# Patient Record
Sex: Male | Born: 1950
Health system: Southern US, Community
[De-identification: ages and names within clinical notes are randomized; demographics above are authoritative.]

## PROBLEM LIST (undated history)

## (undated) DIAGNOSIS — R945 Abnormal results of liver function studies: Secondary | ICD-10-CM

## (undated) DIAGNOSIS — M859 Disorder of bone density and structure, unspecified: Secondary | ICD-10-CM

## (undated) DIAGNOSIS — I4510 Unspecified right bundle-branch block: Secondary | ICD-10-CM

## (undated) DIAGNOSIS — E785 Hyperlipidemia, unspecified: Secondary | ICD-10-CM

## (undated) DIAGNOSIS — Z9089 Acquired absence of other organs: Secondary | ICD-10-CM

## (undated) DIAGNOSIS — R748 Abnormal levels of other serum enzymes: Secondary | ICD-10-CM

## (undated) DIAGNOSIS — R7989 Other specified abnormal findings of blood chemistry: Secondary | ICD-10-CM

## (undated) DIAGNOSIS — M1990 Unspecified osteoarthritis, unspecified site: Secondary | ICD-10-CM

## (undated) DIAGNOSIS — I2510 Atherosclerotic heart disease of native coronary artery without angina pectoris: Secondary | ICD-10-CM

## (undated) DIAGNOSIS — Z9852 Vasectomy status: Secondary | ICD-10-CM

## (undated) DIAGNOSIS — C801 Malignant (primary) neoplasm, unspecified: Secondary | ICD-10-CM

## (undated) DIAGNOSIS — F419 Anxiety disorder, unspecified: Secondary | ICD-10-CM

## (undated) DIAGNOSIS — I420 Dilated cardiomyopathy: Secondary | ICD-10-CM

## (undated) DIAGNOSIS — R001 Bradycardia, unspecified: Secondary | ICD-10-CM

## (undated) DIAGNOSIS — I493 Ventricular premature depolarization: Secondary | ICD-10-CM

## (undated) DIAGNOSIS — M8580 Other specified disorders of bone density and structure, unspecified site: Secondary | ICD-10-CM

## (undated) DIAGNOSIS — K4090 Unilateral inguinal hernia, without obstruction or gangrene, not specified as recurrent: Secondary | ICD-10-CM

## (undated) DIAGNOSIS — E230 Hypopituitarism: Secondary | ICD-10-CM

## (undated) HISTORY — DX: Acquired absence of other organs: Z90.89

## (undated) HISTORY — DX: Vasectomy status: Z98.52

## (undated) HISTORY — DX: Unilateral inguinal hernia, without obstruction or gangrene, not specified as recurrent: K40.90

## (undated) HISTORY — DX: Bradycardia, unspecified: R00.1

## (undated) HISTORY — DX: Disorder of bone density and structure, unspecified: M85.9

## (undated) HISTORY — DX: Other specified disorders of bone density and structure, unspecified site: M85.80

## (undated) HISTORY — DX: Ventricular premature depolarization: I49.3

## (undated) HISTORY — DX: Hypopituitarism: E23.0

## (undated) HISTORY — PX: TONSILLECTOMY: SUR1361

## (undated) HISTORY — DX: Abnormal results of liver function studies: R94.5

## (undated) HISTORY — PX: CARDIAC CATHETERIZATION: SHX172

## (undated) HISTORY — PX: VASECTOMY: SHX75

## (undated) HISTORY — DX: Other specified abnormal findings of blood chemistry: R79.89

## (undated) HISTORY — PX: CYSTECTOMY: SUR359

## (undated) HISTORY — PX: HERNIA REPAIR: SHX51

## (undated) HISTORY — DX: Abnormal levels of other serum enzymes: R74.8

## (undated) HISTORY — DX: Hyperlipidemia, unspecified: E78.5

## (undated) HISTORY — DX: Atherosclerotic heart disease of native coronary artery without angina pectoris: I25.10

## (undated) HISTORY — DX: Unspecified right bundle-branch block: I45.10

---

## 2001-05-04 ENCOUNTER — Emergency Department (HOSPITAL_COMMUNITY): Admission: EM | Admit: 2001-05-04 | Discharge: 2001-05-04 | Payer: Self-pay | Admitting: Emergency Medicine

## 2001-05-07 ENCOUNTER — Emergency Department (HOSPITAL_COMMUNITY): Admission: EM | Admit: 2001-05-07 | Discharge: 2001-05-07 | Payer: Self-pay

## 2001-05-11 ENCOUNTER — Encounter (HOSPITAL_COMMUNITY): Admission: RE | Admit: 2001-05-11 | Discharge: 2001-08-09 | Payer: Self-pay

## 2005-10-30 HISTORY — PX: COLONOSCOPY: SHX174

## 2009-02-07 ENCOUNTER — Encounter: Admission: RE | Admit: 2009-02-07 | Discharge: 2009-02-07 | Payer: Self-pay | Admitting: Family Medicine

## 2009-04-09 ENCOUNTER — Encounter: Admission: RE | Admit: 2009-04-09 | Discharge: 2009-04-09 | Payer: Self-pay | Admitting: Family Medicine

## 2009-07-23 ENCOUNTER — Encounter: Admission: RE | Admit: 2009-07-23 | Discharge: 2009-07-23 | Payer: Self-pay | Admitting: Family Medicine

## 2011-11-09 ENCOUNTER — Emergency Department (INDEPENDENT_AMBULATORY_CARE_PROVIDER_SITE_OTHER)
Admission: EM | Admit: 2011-11-09 | Discharge: 2011-11-09 | Disposition: A | Discharge status: Home / Self Care | Payer: Self-pay | Source: Home / Self Care | Attending: Emergency Medicine | Admitting: Emergency Medicine

## 2011-11-09 ENCOUNTER — Other Ambulatory Visit: Payer: Self-pay

## 2011-11-09 ENCOUNTER — Encounter: Payer: Self-pay | Admitting: *Deleted

## 2011-11-09 DIAGNOSIS — S0990XA Unspecified injury of head, initial encounter: Secondary | ICD-10-CM

## 2011-11-09 DIAGNOSIS — I4949 Other premature depolarization: Secondary | ICD-10-CM

## 2011-11-09 DIAGNOSIS — I493 Ventricular premature depolarization: Secondary | ICD-10-CM

## 2011-11-09 HISTORY — DX: Anxiety disorder, unspecified: F41.9

## 2011-11-09 NOTE — ED Provider Notes (Addendum)
History     CSN: [id number] Arrival date & time: 11/09/2011  7:19 PM   First MD Initiated Contact with Patient 11/09/11 1821      Chief Complaint  Patient presents with  . Head Injury    (Consider location/radiation/quality/duration/timing/severity/associated sx's/prior treatment) Patient is a 60 y.o. male presenting with head injury. The history is provided by the patient.  Head Injury  The incident occurred 3 to 5 hours ago. He came to the ER via walk-in. The injury mechanism was a direct blow. There was no loss of consciousness. The volume of blood lost was minimal. The quality of the pain is described as sharp. The pain is at a severity of 1/10. The pain is mild. Associated symptoms include tinnitus. Pertinent negatives include no numbness, no blurred vision, no vomiting, no disorientation, no weakness and no memory loss. He has tried nothing for the symptoms.    Past Medical History  Diagnosis Date  . Asymptomatic PVCs   . Low testosterone   . Vitamin D deficiency   . Anxiety     Past Surgical History  Procedure Date  . Tonsillectomy   . Cystectomy     History reviewed. No pertinent family history.  History  Substance Use Topics  . Smoking status: Not on file  . Smokeless tobacco: Not on file  . Alcohol Use: No      Review of Systems  Constitutional: Negative for fever, activity change and fatigue.  HENT: Positive for tinnitus.   Eyes: Negative for blurred vision.  Respiratory: Negative for chest tightness and shortness of breath.   Cardiovascular: Negative for chest pain, palpitations and leg swelling.  Gastrointestinal: Negative for vomiting.  Neurological: Negative for dizziness, weakness, numbness and headaches.  Psychiatric/Behavioral: Negative for memory loss.    Allergies  Review of patient's allergies indicates no known allergies.  Home Medications   Current Outpatient Rx  Name Route Sig Dispense Refill  . VITAMIN D BOOSTER PO Oral Take by  mouth.      [name] Kitchen ZOLOFT PO Oral Take by mouth.      . TESTOSTERONE 20.25 MG/ACT (1.62%) TD GEL Transdermal Place onto the skin.        BP 132/70  Pulse 46  Temp(Src) 97.5 F (36.4 C) (Oral)  Resp 14  SpO2 96%  Physical Exam  Nursing note and vitals reviewed. Constitutional: He appears well-developed and well-nourished. No distress.  HENT:  Head: Normocephalic. Head is with contusion.    Neck: Normal range of motion.  Cardiovascular: Normal heart sounds and normal pulses.  Frequent extrasystoles are present. Bradycardia present.  PMI is not displaced.  Exam reveals no gallop.   Pulmonary/Chest: Breath sounds normal. No respiratory distress.  Neurological: He is alert. He has normal strength. He is not disoriented. No cranial nerve deficit or sensory deficit.    ED Course  Procedures (including critical care time)  Labs Reviewed - No data to display No results found.   1. Head injury   2. Premature ventricular beats       MDM  Head injury No LOC minimal superficial laceration- No neurological concerns- Incidentally irregular- HR noted on exam. Patient aware of previous PVC's. Had treadmill exam about 30 years ago, told to exercise regularly. Had physical about 1 month ago NORMAL. NO CONCERNS WERE ELICITED THEN.  EKG: PVC's seem to be same foci - biggemminal at times      Jimmie Molly, MD 11/09/11 2037  Jimmie Molly, MD 11/09/11 4098  Jimmie Molly, MD  11/09/11 2040 

## 2011-11-09 NOTE — ED Notes (Addendum)
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  Denies LOC, or any other c/o's - required to have eval for work.  Irreg HR palpated - pt reports old hx of PVCs that resolved; denies any palpitations, lightheadedness, CP, or any other c/o's.  Pt regularly exercises.

## 2011-12-15 ENCOUNTER — Encounter: Payer: Self-pay | Admitting: Cardiology

## 2011-12-15 ENCOUNTER — Other Ambulatory Visit: Payer: Self-pay | Admitting: Cardiology

## 2011-12-15 MED ORDER — SODIUM CHLORIDE 0.9 % IV SOLN: 250.0000 mL | INTRAVENOUS | Status: DC | PRN

## 2011-12-15 MED ORDER — SODIUM CHLORIDE 0.9 % IJ SOLN: 3.0000 mL | Freq: Two times a day (BID) | INTRAMUSCULAR | Status: DC

## 2011-12-15 MED ORDER — SODIUM CHLORIDE 0.9 % IJ SOLN: 3.0000 mL | INTRAMUSCULAR | Status: DC | PRN

## 2011-12-15 NOTE — H&P (Signed)
Office Visit     Patient: Patrick Barnes, Patrick Barnes Provider: Michaell Barnes. Patrick Fear, NP  DOB: May 01, 1951   Age: 61 Y   Sex: Male Date: 12/14/2011  Phone: [phone]  Address: [address], [place], [place]  Pcp: Patrick Barnes    --------------------------------------------------------------------------------  Subjective:    CC:      1. Barnes/SPOKE TO Patrick Barnes SAT/TOLD TO MAKE APPT TODAY RE MEDS.      HPI:     General:           Patrick Barnes is a 61 yo male followed by Patrick Barnes with a hx of evaluation for PVC's after going to Urgent Care after hitting his head on a shelf 2 in [place]. He was noted to have an irregular heart beat at that time and recommended that he followup with a Cardiologist. The EKG at urgent care showed NSR with occasional PVC's. He had a nuclear stress test 12/02/11 without ischemia but reduced EF which was confirmed to be EF 30-35% by echocardiogram. Patrick Barnes was concerned that the level of his PVCs may be precipitating a cardiomyopathy. Cardiac monitor revealed 16% PVCs during a 24 hr period and she would like to proceed with a cardiac cath.          He denies any chest pain or SOB, any palpitations, dizziness or syncope. [name] Kitchen He exercises on the elliptical an hour daily. He has been having a cough and congestion with DX: Bronchitis last wednesday and started Z pak. He had wheezing. .         Over the weekend his weight increased from his usual at 115 to 119 and he felt he was having to work harder at his exercise.     ROS:      as noted in HPI, had mild fever last week and cough, occasional wheezing at night. 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     Medical History: Hx of PVC's, has seen Cardiology, Hypogonadotropic hypogonadism ( Patrick Barnes, Patrick Barnes), Hx of Elevated LFT's, resolved, colonoscopy Dec. 2006 had  internal nonbleeding small hemorrhoids, repeat 10 years Patrick Barnes, Elevated CPK, under eval, low bone density, Endocrine following, Tonsillectomy, Vasectomy, Low resting pulse, asymptomatic, Sees dermatology once or twice yearly for skin checks, History of elevated fasting glucose 108 in 11/09; 102 in 2/10, Inguinal hernia.      Surgical History: Cyst removal x 10 , Tonsillectomy , vasectomy , Dental Surgery .      Family History:  Father: deceased 74 yrs Accidental Death Mother: deceased 61 yrs Cancer Paternal Grand Father: deceased Unknown Paternal Grand Mother: deceased Old Age Maternal Grand Father: deceased Prostate Cancer, Diabetes Mellitus Maternal Grand Mother: deceased Old Age Brother 1: alive Half Brother - no known medical problems      Social History:      General: History of smoking  cigarettes:  Never smoked. no Smoking. no Tobacco Exposure. Alcohol: yes, occasionally. Caffeine: yes, very little. no Recreational drug use. Exercise: yes, 6-7 times a week/ daily 60-70 minutes. Occupation: unemployed, Retired. Marital Status: married. Children: 3.      Medications: Androgel 1.62% 20.25 mg/pump Gel apply 2 pumps Once daily, Zoloft 50 MG Tablet 1 tablet Once a day, Altace 2.5 MG Capsule 1 capsule STOPPED, Vitamin D (Ergocalciferol) 50000 UNIT Capsule 1 capsule Once a Week, Hydrocodone-Homatropine 5-1.5 MG/5ML  Syrup 5 ml as needed every 6 hrs, Azithromycin 250 MG Tablet 2 tablet on the first day, then 1 tablet daily for 4 days Once a day, Medication List reviewed and reconciled with the patient     Allergies: N.K.D.A.     Objective:    Vitals: Wt 125.4, Wt change 4.4 lb, Ht 66, BMI 20.24, Pulse sitting 68, BP sitting 120/84.     Examination:     Cardiology, General:         GENERAL APPEARANCE: pleasant, NAD.  HEENT: unremarkable.  CAROTID UPSTROKE: normal, no bruit.  JVD: flat.  HEART SOUNDS: regular, normal S1, S2, no S3 or S4.  MURMUR: absent.  LUNGS: no rales or wheezes.  ABDOMEN:  soft, non tender, positive bowel sounds, no masses felt.  EXTREMITIES: no leg edema.  PERIPHERAL PULSES: 2 plus bilateral.            Assessment:    Assessment:  1. PVCs (premature ventricular contractions) - 427.69 (Primary)   2. CHF - 428.0   3. Shortness of breath - 786.05     Plan:    1. PVCs (premature ventricular contractions)   With the level of PVCs and reduced EF, Patrick Barnes plans to proceed with cardiac cath. Risks and benefits of cardiac catheterization have been reviewed including risk of stroke, heart attack, death, bleeding, renal impariment and arterial damage. There was ample oppurtuny to answer questions. Alternatives were discussed. Patient understands and wishes to proceed. I will arrange for next week to give him time to complete his antibiotics and make sure he feels his cough has resolved.       2. CHF  Restart Altace Capsule, 2.5 MG, 1 capsule, Orally, Once a day .         LAB: CBC with Diff      WBC 16.5 4.0-11.0 - K/ul H        RBC 3.18 4.20-5.80 - M/uL L        HGB 10.4 13.0-17.0 - g/dL L        HCT 16.1 09.6-04.5 - % L       MCH 32.6 27.0-33.0 - pg         MPV 7.3 7.5-10.7 - fL L        MCV 97.0 80.0-94.0 - fL H       MCHC 33.6 32.0-36.0 - g/dL        RDW 40.9 81.1-91.4 - %         PLT 411 150-400 - K/uL H        NEUT % 88.2 43.3-71.9 - % H        LYMPH% 5.2 16.8-43.5 - % L       MONO % 5.2 4.6-12.4 - %        EOS % 0.7 0.0-7.8 - %        BASO % 0.7 0.0-1.0 - %         NEUT # 14.5 1.9-7.2 - K/uL H        LYMPH# 0.90 1.10-2.70 - K/uL L        MONO # 0.9 0.3-0.8 - K/uL H       EOS # 0.1 0.0-0.6 - K/uL        BASO # 0.1 0.0-0.1 - K/uL               FERGUSON,CYNTHIA A 12/14/2011 01:45:10 PM > cc Patrick Barnes for cath next week, he is on Zpak due to cough...WBC  does support infectious process Janina Mayo 12/14/2011 02:01:15 PM > plesae recheck CBC next monday stat before cath [facility] 12/14/2011 03:55:52 PM > Pt notified. CBC ordered stat for 12/21/11.         LAB: Basic Metabolic     GLUCOSE 83 70-99 - mg/dL        BUN 17 1-61 - mg/dL        CREATININE 0.96 0.60-1.30 - mg/dl        eGFR (NON-AFRICAN AMERICAN) 93 >60 - calc        eGFR (AFRICAN AMERICAN) 112 >60 - calc        SODIUM 138 136-145 - mmol/L        POTASSIUM 4.4 3.5-5.5 - mmol/L        CHLORIDE 105 98-107 - mmol/L        C02 29 22-32 - mg/dL        ANION GAP 8.9 0.4-54.0 - mmol/L         CALCIUM 8.2 8.6-10.3 - mg/dL L              FERGUSON,CYNTHIA A 12/14/2011 02:36:38 PM > ok, Amy once call please cc to [facility] for cath,have him restart the ALtace 2.5 mg po qd. Harward,Amy 12/14/2011 03:53:08 PM > Pt notified. He will restart Altace. To [facility] for cath. McVey,Linda 12/14/2011 03:58:02 PM >        LAB: PT and PTT ([id number])     aPTT 32 24-33 - SEC        INR 1.2 0.8-1.2 -         Prothrombin Time 12.9 9.1-12.0 - SEC H              FERGUSON,CYNTHIA A 12/15/2011 09:56:24 AM > ok for cath      3. Shortness of breath        LAB: BNP     B-NATRIURETIC PEPTIDE 57 0-100 - pg/mL               FERGUSON,CYNTHIA A 12/14/2011 03:35:58 PM > stable, no overt fluid overload and WBC supports infection for which he is on Z pak Harward,Amy 12/14/2011 03:52:57 PM > Pt. notified.   Diagnostic Imaging:Chest PA/Lat (Ordered for 12/14/2011) pneumonia, Phairas,Jodi 12/14/2011 01:03:25 PM > , x-ray completed. Janina Mayo 12/14/2011 05:03:48 PM > please notify primary MD about chest xray patient is on antibiotics at present for bronchitis and may need something more [facility] 12/14/2011 07:07:24 PM > please call pt and ask him to make appt to follow up pneumonia; appt sooner if not improving          Immunizations:       Labs:      Procedure Codes: 47829 ECL CBC PLATELET DIFF, 80048 ECL BMP, 83880 ECL B NP, 56213 BLOOD COLLECTION ROUTINE VENIPUNCTURE     Preventive:           Follow Up: Barnes pending cath (Reason: Reduced EF, post cath)        Provider: Michaell Barnes. Patrick Fear, NP  Patient:  Patrick Barnes, Patrick Barnes  DOB: 03-19-1951  Date: 12/14/2011

## 2011-12-25 ENCOUNTER — Other Ambulatory Visit: Payer: Self-pay | Admitting: Family Medicine

## 2011-12-25 ENCOUNTER — Ambulatory Visit
Admission: RE | Admit: 2011-12-25 | Discharge: 2011-12-25 | Disposition: A | Discharge status: Home / Self Care | Payer: Federal, State, Local not specified - PPO | Source: Ambulatory Visit | Attending: Family Medicine | Admitting: Family Medicine

## 2011-12-25 MED ORDER — IOHEXOL 300 MG/ML  SOLN
75.0000 mL | Freq: Once | INTRAMUSCULAR | Status: AC | PRN
  Administered 2011-12-25: 75 mL via INTRAVENOUS

## 2011-12-28 ENCOUNTER — Other Ambulatory Visit: Payer: Self-pay | Admitting: Family Medicine

## 2011-12-28 DIAGNOSIS — R9389 Abnormal findings on diagnostic imaging of other specified body structures: Secondary | ICD-10-CM

## 2011-12-29 ENCOUNTER — Encounter (HOSPITAL_BASED_OUTPATIENT_CLINIC_OR_DEPARTMENT_OTHER): Admission: RE | Payer: Self-pay | Source: Ambulatory Visit

## 2011-12-29 ENCOUNTER — Inpatient Hospital Stay (HOSPITAL_BASED_OUTPATIENT_CLINIC_OR_DEPARTMENT_OTHER)
Admission: RE | Admit: 2011-12-29 | Payer: Federal, State, Local not specified - PPO | Source: Ambulatory Visit | Admitting: Cardiology

## 2011-12-29 SURGERY — JV LEFT HEART CATHETERIZATION WITH CORONARY ANGIOGRAM
Anesthesia: Moderate Sedation

## 2012-01-01 DIAGNOSIS — I2510 Atherosclerotic heart disease of native coronary artery without angina pectoris: Secondary | ICD-10-CM

## 2012-01-01 HISTORY — DX: Atherosclerotic heart disease of native coronary artery without angina pectoris: I25.10

## 2012-01-08 ENCOUNTER — Other Ambulatory Visit: Payer: Self-pay | Admitting: Cardiology

## 2012-01-11 ENCOUNTER — Other Ambulatory Visit: Payer: Self-pay | Admitting: Cardiology

## 2012-01-11 ENCOUNTER — Encounter: Payer: Self-pay | Admitting: Cardiology

## 2012-01-12 ENCOUNTER — Inpatient Hospital Stay (HOSPITAL_COMMUNITY)
Admission: AD | Admit: 2012-01-12 | Discharge: 2012-01-14 | DRG: 854 | Disposition: A | Discharge status: Home / Self Care | Payer: Federal, State, Local not specified - PPO | Source: Ambulatory Visit | Attending: Cardiology | Admitting: Cardiology

## 2012-01-12 ENCOUNTER — Encounter (HOSPITAL_COMMUNITY): Payer: Self-pay | Admitting: General Practice

## 2012-01-12 ENCOUNTER — Inpatient Hospital Stay (HOSPITAL_BASED_OUTPATIENT_CLINIC_OR_DEPARTMENT_OTHER)
Admission: RE | Admit: 2012-01-12 | Discharge: 2012-01-12 | Disposition: A | Discharge status: Hospice | Payer: Federal, State, Local not specified - PPO | Source: Ambulatory Visit | Attending: Cardiology | Admitting: Cardiology

## 2012-01-12 ENCOUNTER — Encounter (HOSPITAL_BASED_OUTPATIENT_CLINIC_OR_DEPARTMENT_OTHER): Payer: Self-pay | Admitting: Cardiology

## 2012-01-12 ENCOUNTER — Encounter (HOSPITAL_BASED_OUTPATIENT_CLINIC_OR_DEPARTMENT_OTHER)
Admission: RE | Disposition: A | Discharge status: Hospice | Payer: Self-pay | Source: Ambulatory Visit | Attending: Cardiology

## 2012-01-12 DIAGNOSIS — E291 Testicular hypofunction: Secondary | ICD-10-CM | POA: Insufficient documentation

## 2012-01-12 DIAGNOSIS — I4949 Other premature depolarization: Secondary | ICD-10-CM | POA: Diagnosis present

## 2012-01-12 DIAGNOSIS — I498 Other specified cardiac arrhythmias: Secondary | ICD-10-CM | POA: Diagnosis present

## 2012-01-12 DIAGNOSIS — I493 Ventricular premature depolarization: Secondary | ICD-10-CM | POA: Insufficient documentation

## 2012-01-12 DIAGNOSIS — I420 Dilated cardiomyopathy: Secondary | ICD-10-CM | POA: Diagnosis present

## 2012-01-12 DIAGNOSIS — I428 Other cardiomyopathies: Secondary | ICD-10-CM | POA: Diagnosis present

## 2012-01-12 DIAGNOSIS — I2510 Atherosclerotic heart disease of native coronary artery without angina pectoris: Secondary | ICD-10-CM | POA: Insufficient documentation

## 2012-01-12 DIAGNOSIS — E785 Hyperlipidemia, unspecified: Secondary | ICD-10-CM | POA: Diagnosis present

## 2012-01-12 HISTORY — DX: Dilated cardiomyopathy: I42.0

## 2012-01-12 LAB — CBC
HCT: 37 % — ABNORMAL LOW (ref 39.0–52.0)
Hemoglobin: 12.3 g/dL — ABNORMAL LOW (ref 13.0–17.0)
MCH: 31.8 pg (ref 26.0–34.0)
MCV: 95.6 fL (ref 78.0–100.0)
RBC: 3.87 MIL/uL — ABNORMAL LOW (ref 4.22–5.81)

## 2012-01-12 LAB — BASIC METABOLIC PANEL
CO2: 25 mEq/L (ref 19–32)
Glucose, Bld: 112 mg/dL — ABNORMAL HIGH (ref 70–99)
Potassium: 4.1 mEq/L (ref 3.5–5.1)
Sodium: 138 mEq/L (ref 135–145)

## 2012-01-12 SURGERY — JV LEFT HEART CATHETERIZATION WITH CORONARY ANGIOGRAM
Anesthesia: Moderate Sedation

## 2012-01-12 MED ORDER — ACETAMINOPHEN 325 MG PO TABS: 650.0000 mg | ORAL_TABLET | ORAL | Status: DC | PRN

## 2012-01-12 MED ORDER — SODIUM CHLORIDE 0.9 % IV SOLN
INTRAVENOUS | Status: DC
  Administered 2012-01-12: 12:00:00 via INTRAVENOUS

## 2012-01-12 MED ORDER — SODIUM CHLORIDE 0.9 % IV SOLN: INTRAVENOUS | Status: DC

## 2012-01-12 MED ORDER — ONDANSETRON HCL 4 MG/2ML IJ SOLN: 4.0000 mg | Freq: Four times a day (QID) | INTRAMUSCULAR | Status: DC | PRN

## 2012-01-12 MED ORDER — ASPIRIN EC 325 MG PO TBEC: 325.0000 mg | DELAYED_RELEASE_TABLET | Freq: Every day | ORAL | Status: DC

## 2012-01-12 MED ORDER — NITROGLYCERIN 0.4 MG SL SUBL: 0.4000 mg | SUBLINGUAL_TABLET | SUBLINGUAL | Status: DC | PRN

## 2012-01-12 MED ORDER — ASPIRIN 300 MG RE SUPP
300.0000 mg | RECTAL | Status: DC
  Filled 2012-01-12: qty 1

## 2012-01-12 MED ORDER — SERTRALINE HCL 50 MG PO TABS
50.0000 mg | ORAL_TABLET | Freq: Every day | ORAL | Status: DC
  Filled 2012-01-12 (×2): qty 1

## 2012-01-12 MED ORDER — SODIUM CHLORIDE 0.9 % IJ SOLN: 3.0000 mL | Freq: Two times a day (BID) | INTRAMUSCULAR | Status: DC

## 2012-01-12 MED ORDER — ASPIRIN 81 MG PO CHEW
324.0000 mg | CHEWABLE_TABLET | ORAL | Status: AC
  Administered 2012-01-13: 324 mg via ORAL
  Filled 2012-01-12: qty 4

## 2012-01-12 MED ORDER — SODIUM CHLORIDE 0.9 % IJ SOLN: 3.0000 mL | INTRAMUSCULAR | Status: DC | PRN

## 2012-01-12 MED ORDER — SODIUM CHLORIDE 0.9 % IV SOLN: 250.0000 mL | INTRAVENOUS | Status: DC | PRN

## 2012-01-12 MED ORDER — SODIUM CHLORIDE 0.9 % IJ SOLN
3.0000 mL | Freq: Two times a day (BID) | INTRAMUSCULAR | Status: DC
  Administered 2012-01-13: 3 mL via INTRAVENOUS

## 2012-01-12 MED ORDER — DIAZEPAM 5 MG PO TABS
5.0000 mg | ORAL_TABLET | ORAL | Status: AC
  Administered 2012-01-13: 5 mg via ORAL
  Filled 2012-01-12: qty 1

## 2012-01-12 MED ORDER — SODIUM CHLORIDE 0.9 % IV SOLN
INTRAVENOUS | Status: DC
  Administered 2012-01-12: 08:00:00 via INTRAVENOUS

## 2012-01-12 MED ORDER — SODIUM CHLORIDE 0.9 % IV SOLN: 1.0000 mL/kg/h | INTRAVENOUS | Status: DC

## 2012-01-12 MED ORDER — ASPIRIN 81 MG PO CHEW: 324.0000 mg | CHEWABLE_TABLET | ORAL | Status: DC

## 2012-01-12 MED ORDER — DIAZEPAM 5 MG PO TABS
5.0000 mg | ORAL_TABLET | ORAL | Status: AC
  Administered 2012-01-12: 5 mg via ORAL

## 2012-01-12 MED ORDER — ASPIRIN 81 MG PO CHEW
324.0000 mg | CHEWABLE_TABLET | ORAL | Status: AC
  Administered 2012-01-12: 324 mg via ORAL

## 2012-01-12 MED ORDER — ASPIRIN EC 81 MG PO TBEC: 81.0000 mg | DELAYED_RELEASE_TABLET | Freq: Every day | ORAL | Status: DC

## 2012-01-12 MED ORDER — RAMIPRIL 2.5 MG PO CAPS
2.5000 mg | ORAL_CAPSULE | Freq: Every day | ORAL | Status: DC
  Administered 2012-01-13: 2.5 mg via ORAL
  Filled 2012-01-12 (×2): qty 1

## 2012-01-12 NOTE — Progress Notes (Signed)
Bedrest begins @ 1010, Dr. Mayford Knife in to discuss results with patient and wife.

## 2012-01-12 NOTE — Interval H&P Note (Signed)
History and Physical Interval Note:  01/12/2012 9:00 AM  Patrick Barnes  has presented today for surgery, with the diagnosis of chest pain  The various methods of treatment have been discussed with the patient and family. After consideration of risks, benefits and other options for treatment, the patient has consented to  Procedure(s) (LRB): JV LEFT HEART CATHETERIZATION WITH CORONARY ANGIOGRAM (N/A) as a surgical intervention .  The patients' history has been reviewed, patient examined, no change in status, stable for surgery.  I have reviewed the patients' chart and labs.  Questions were answered to the patient's satisfaction.     TURNER,TRACI R

## 2012-01-12 NOTE — H&P (Signed)
Office Visit     Patient: Patrick Barnes, Patrick Barnes Provider: Michaell Cowing. Emelda Fear, NP  DOB: 1950/12/19 Age: 61 Y Sex: Male Date: 12/24/2011  Phone: [phone]   Address: [address] [place], [id number]  Pcp: ELIZABETH BARNES       Subjective:     CC:    1. TT/EVALUATE PRIOR TO HEART CATH.        HPI:  General:  Patrick Barnes is a 61 yo male followed by Dr Mayford Knife with a hx of evaluation for PVC's after going to Urgent Care after hitting his head on a shelf 2 in [place]. He was noted to have an irregular heart beat at that time. The EKG at urgent care showed NSR with occasional PVC's. He had a nuclear stress test 12/02/11 without ischemia but reduced EF which was confirmed to be EF 30-35% by echocardiogram. Dr Mayford Knife was concerned that the level of his PVCs may be precipitating a cardiomyopathy. Cardiac monitor revealed 16% PVCs during a 24 hr period and she would like to proceed with a cardiac cath. However at last visit 12/13/10 he was having productive cough and CXR confirmed pneumonia for which he has been treated by Dr Clarene Duke with 2 rounds of antibiotics, he is currently on the Augment at this time and will complete tomorrow. His cough and congestion has resolved. No fever chills. He denies any chest pain or SOB, any palpitations, dizziness or syncope. [name] Kitchen He exercises on the elliptical an hour daily. .  Home weight has returned to his usual at 115.4 and swelling improved.       ROS:  as noted in HPI,no GI nor neurological complaints, no headache, appetite stable he has f/u tomorrow with Dr Clarene Duke regarding pneumonia. prior to cardiac cath.       Medical History: Hx of PVC's, has seen Cardiology, Hypogonadotropic hypogonadism ( Dr. Sharl Ma, Dr. Annabell Howells), Hx of Elevated LFT's, resolved, colonoscopy Dec. 2006 had internal nonbleeding small hemorrhoids, repeat 10 years Dr. Bosie Clos, Elevated CPK, under eval, low bone density, Endocrine following, Tonsillectomy, Vasectomy, Low resting pulse,  asymptomatic, Sees dermatology once or twice yearly for skin checks, History of elevated fasting glucose 108 in 11/09; 102 in 2/10, Inguinal hernia.        Surgical History: Cyst removal x 10 , Tonsillectomy , vasectomy , Dental Surgery .        Family History: Father: deceased 24 yrs Accidental Death Mother: deceased 53 yrs Cancer Paternal Grand Father: deceased Unknown Paternal Grand Mother: deceased Old Age Maternal Grand Father: deceased Prostate Cancer, Diabetes Mellitus Maternal Grand Mother: deceased Old Age Brother 1: alive Half Brother - no known medical problems        Social History:  General: History of smoking cigarettes: Never smoked. no Smoking. no Tobacco Exposure. Alcohol: yes, occasionally. Caffeine: yes, very little. no Recreational drug use. Exercise: yes, 6-7 times a week/ daily 60-70 minutes. Occupation: unemployed, Retired. Marital Status: married. Children: 3.        Medications: Androgel 1.62% 20.25 mg/pump Gel apply 2 pumps Once daily, Zoloft 50 MG Tablet 1 tablet Once a day, Vitamin D (Ergocalciferol) 50000 UNIT Capsule 1 capsule Once a Week, Altace 2.5 MG Capsule 1 capsule Once a day, Augmentin 875-125 MG Tablet 1 tablet every 12 hrs, stop date 12/25/2011, Mucinex 600 MG Tablet Extended Release 12 Hour 2 tab Twice daily, Medication List reviewed and reconciled with the patient       Allergies: N.K.D.A.      Objective:  Vitals: Wt 122, Wt change -.4 lb, Ht 66, BMI 19.69, Pulse sitting 60, BP sitting 134/88.       Examination:  Cardiology, General:  GENERAL APPEARANCE: pleasant, NAD. HEENT: unremarkable. CAROTID UPSTROKE: normal, no bruit. JVD: flat. HEART SOUNDS: regular, normal S1, S2, no S3 or S4. MURMUR: absent. LUNGS: no rales or wheezes. ABDOMEN: soft, non tender, positive bowel sounds, no masses felt. EXTREMITIES: 1+ edema above sock line of left leg only . PERIPHERAL PULSES: 2 plus bilateral.        Assessment:     Assessment:  1. PVCs  (premature ventricular contractions) - 427.69 (Primary)  2. CHF - 428.0, EF 30-40%    Plan:     1. PVCs (premature ventricular contractions)  Dr Mayford Knife plans to proceed with cardiac cath. Risks and benefits of cardiac catheterization have been reviewed including risk of stroke, heart attack, death, bleeding, renal impariment and arterial damage. There was ample oppurtuny to answer questions. Alternatives were discussed. Patient understands and wishes to proceed. Since pt has been recently treated for pneumonia and currently finishing second round of antibiotics, pt is feeling much better and feels ready to proceed, however, we will await office visit with Dr Clarene Duke and make sure he feels pt is able to proceed with cardiac cath next Tuesday 12/29/11, and if he agrees pt will obtain labs tomorrow for [place] after his office visit in preparation.       2. CHF Continue Altace Capsule, 2.5 MG, 1 capsule, Orally, Once a day .  I recommend that after cardiac cath we continue to titrate ACEI and consider adding.        Immunizations:        Labs:        Preventive:         Follow Up: TT pending cath (Reason: PVCs, reduced EF)      Provider: Michaell Cowing. Emelda Fear, NP  Patient: Patrick Barnes, Patrick Barnes DOB: [birth year] Date: 12/24/2011

## 2012-01-12 NOTE — Op Note (Signed)
PROCEDURE:  Left heart catheterization with selective coronary angiography, left ventriculogram.  INDICATIONS:  Cardiomyopathy  The risks, benefits, and details of the procedure were explained to the patient.  The patient verbalized understanding and wanted to proceed.  Informed written consent was obtained.  PROCEDURE TECHNIQUE:  After Xylocaine anesthesia a 81F sheath was placed in the right femoral artery with a single anterior needle wall stick.   Left coronary angiography was done using a Judkins L4 guide catheter.  Right coronary angiography was done using a Judkins R4 guide catheter.  Left ventriculography was done using a pigtail catheter.    CONTRAST:  Total of 45cc  COMPLICATIONS:  None.    HEMODYNAMICS:  No Left ventricular pressure measured due to possible LV thrombus by cath ANGIOGRAPHIC DATA:   The left main coronary artery is widely patent.  The left anterior descending artery is widely patent in the ostial portion.  It gives rise to a large first diagonal which bifurcates into 2 daughter vessels both of which are widely patent.  The proximal LAD has a long 80% stenosis and then gives rise to a second diagonal which is moderate in size and bifurcates into 2 daughter vessels which are patent.  Just after the second diagonal there is a 30-40% narrowing in the mid LAD.  The LAD then gives rise to a 3rd small diagonal which is patent.  The ongoing LAD is widely patent.    The left circumflex artery is widely patent and gives rise to a large first OM which is widely patent.  The ongoing circumflex is patent and gives rise to a second OM which is small and patent.    The right coronary artery is widely patent.  It gives rise to a moderate sized acute RV marginal branch which is patent.  Distally it gives rise to a PDA which is patent and a posterolateral vessel which is widely patent and gives rise to 3 branches all of which are patent.  LEFT VENTRICULOGRAM:  Left ventricular angiogram  was not performed due to possibility of LV thrombus. IMPRESSIONS:  1. Normal left main coronary artery. 2. Normal ostial left anterior descending artery with long 80% stenosis in the proximal portion and 30-40% stenosis in the mid LAD.  All branches of LAD are patent. 3. Normal left circumflex artery and its branches. 4. Normal right coronary artery. 5. Moderate LV dysfunction by echo EF 30-35% 6.  Small right groin hematoma resolved after sheath removal.  RECOMMENDATION:   1.  Admit to telemetry bed 2.  NPO after midnight 3.  PCI of LAD in am by Dr. Eldridge Dace 4.  No anticoagulation at present due to right groin hematoma. 5.  Repeat 2D echo to assess for LV thrombus

## 2012-01-12 NOTE — Progress Notes (Signed)
Transported to 3703 bed 1, via stretcher and monitor.  Reported called to [employer].

## 2012-01-13 ENCOUNTER — Encounter (HOSPITAL_COMMUNITY)
Admission: AD | Disposition: A | Discharge status: Home / Self Care | Payer: Self-pay | Source: Ambulatory Visit | Attending: Cardiology

## 2012-01-13 ENCOUNTER — Other Ambulatory Visit: Payer: Self-pay

## 2012-01-13 ENCOUNTER — Ambulatory Visit (HOSPITAL_COMMUNITY): Admit: 2012-01-13 | Payer: Self-pay | Admitting: Interventional Cardiology

## 2012-01-13 HISTORY — PX: PERCUTANEOUS CORONARY STENT INTERVENTION (PCI-S): SHX5485

## 2012-01-13 LAB — CBC
HCT: 38.5 % — ABNORMAL LOW (ref 39.0–52.0)
Hemoglobin: 12.3 g/dL — ABNORMAL LOW (ref 13.0–17.0)
RBC: 4 MIL/uL — ABNORMAL LOW (ref 4.22–5.81)
RDW: 14.7 % (ref 11.5–15.5)
WBC: 6.8 10*3/uL (ref 4.0–10.5)

## 2012-01-13 LAB — CK TOTAL AND CKMB (NOT AT ARMC)
CK, MB: 4.1 ng/mL — ABNORMAL HIGH (ref 0.3–4.0)
Total CK: 70 U/L (ref 7–232)

## 2012-01-13 LAB — CARDIAC PANEL(CRET KIN+CKTOT+MB+TROPI)
CK, MB: 3.6 ng/mL (ref 0.3–4.0)
Relative Index: INVALID (ref 0.0–2.5)
Relative Index: INVALID (ref 0.0–2.5)
Total CK: 67 U/L (ref 7–232)
Troponin I: <0.3 ng/mL (ref <0.30)
Troponin I: <0.3 ng/mL (ref <0.30)

## 2012-01-13 LAB — BASIC METABOLIC PANEL
BUN: 17 mg/dL (ref 6–23)
Chloride: 105 mEq/L (ref 96–112)
GFR calc Af Amer: >90 mL/min (ref >90)
Potassium: 4.3 mEq/L (ref 3.5–5.1)

## 2012-01-13 LAB — LIPID PANEL
HDL: 61 mg/dL (ref >39)
Total CHOL/HDL Ratio: 2.8 RATIO

## 2012-01-13 LAB — POCT ACTIVATED CLOTTING TIME: Activated Clotting Time: 358 seconds

## 2012-01-13 SURGERY — PERCUTANEOUS CORONARY STENT INTERVENTION (PCI-S)
Anesthesia: LOCAL

## 2012-01-13 SURGERY — PERCUTANEOUS CORONARY STENT INTERVENTION (PCI-S)
Anesthesia: Moderate Sedation | Laterality: Bilateral

## 2012-01-13 MED ORDER — SODIUM CHLORIDE 0.9 % IV SOLN
INTRAVENOUS | Status: DC
  Administered 2012-01-13: 20 mL/h via INTRAVENOUS

## 2012-01-13 MED ORDER — ASPIRIN EC 325 MG PO TBEC
325.0000 mg | DELAYED_RELEASE_TABLET | Freq: Every day | ORAL | Status: DC
  Administered 2012-01-14: 325 mg via ORAL
  Filled 2012-01-13: qty 1

## 2012-01-13 MED ORDER — SODIUM CHLORIDE 0.9 % IV SOLN
0.2500 mg/kg/h | INTRAVENOUS | Status: AC
  Filled 2012-01-13: qty 250

## 2012-01-13 MED ORDER — FENTANYL CITRATE 0.05 MG/ML IJ SOLN
INTRAMUSCULAR | Status: AC
  Filled 2012-01-13: qty 2

## 2012-01-13 MED ORDER — MIDAZOLAM HCL 2 MG/2ML IJ SOLN
INTRAMUSCULAR | Status: AC
  Filled 2012-01-13: qty 2

## 2012-01-13 MED ORDER — MORPHINE SULFATE 2 MG/ML IJ SOLN: 1.0000 mg | INTRAMUSCULAR | Status: DC | PRN

## 2012-01-13 MED ORDER — ONDANSETRON HCL 4 MG/2ML IJ SOLN: 4.0000 mg | Freq: Four times a day (QID) | INTRAMUSCULAR | Status: DC | PRN

## 2012-01-13 MED ORDER — CLOPIDOGREL BISULFATE 300 MG PO TABS
ORAL_TABLET | ORAL | Status: AC
  Filled 2012-01-13: qty 2

## 2012-01-13 MED ORDER — LIDOCAINE HCL (PF) 1 % IJ SOLN
INTRAMUSCULAR | Status: AC
  Filled 2012-01-13: qty 30

## 2012-01-13 MED ORDER — SODIUM CHLORIDE 0.9 % IV SOLN
1.0000 mL/kg/h | INTRAVENOUS | Status: AC
  Administered 2012-01-13: 1 mL/kg/h via INTRAVENOUS

## 2012-01-13 MED ORDER — BIVALIRUDIN 250 MG IV SOLR
INTRAVENOUS | Status: AC
  Filled 2012-01-13: qty 250

## 2012-01-13 MED ORDER — NITROGLYCERIN 0.2 MG/ML ON CALL CATH LAB
INTRAVENOUS | Status: AC
  Filled 2012-01-13: qty 1

## 2012-01-13 MED ORDER — HEPARIN (PORCINE) IN NACL 2-0.9 UNIT/ML-% IJ SOLN
INTRAMUSCULAR | Status: AC
  Filled 2012-01-13: qty 2000

## 2012-01-13 MED ORDER — ACETAMINOPHEN 325 MG PO TABS: 650.0000 mg | ORAL_TABLET | ORAL | Status: DC | PRN

## 2012-01-13 MED ORDER — CLOPIDOGREL BISULFATE 75 MG PO TABS
75.0000 mg | ORAL_TABLET | Freq: Every day | ORAL | Status: DC
  Administered 2012-01-14: 75 mg via ORAL
  Filled 2012-01-13: qty 1

## 2012-01-13 NOTE — Op Note (Signed)
PROCEDURE:  PCI LAD  INDICATIONS:    The risks, benefits, and details of the procedure were explained to the patient.  The patient verbalized understanding and wanted to proceed.  Informed written consent was obtained.  PROCEDURE TECHNIQUE:  After Xylocaine anesthesia a 61F sheath was placed in the right radial artery with a single anterior needle wall stick.   Left coronary angiography was done using a CLS 3  guide catheter.    CONTRAST:  Total of 95  cc.  COMPLICATIONS:  None.      ANGIOGRAPHIC DATA:  There is a diffuse 80% stenosis in the proximal to mid LAD. There is mild atherosclerosis in the remainder of the LAD.  PCI NARRATIVE: A CLS 3.0 guiding catheter is using his left main.  There is difficulty getting a pro-water wire into the LAD so it was placed into the circumflex or additional support.  A BMW wire was then placed into the LAD across the area disease.  A 2.5 x 12 immerge balloon was used to predilate the lesion at 14 atmospheres.  A synergy 3.5 x 20 mm stent was deployed across the diseased area and inflated to 14 atmospheres.  The stent was post dilated with a Mono Vista Quantum Apex balloon, 4.0 x 15, inflated at 16 atmospheres and then to 14 atmospheres.  There is no residual stenosis.  TIMI-3 flow was maintained throughout.  Intracoronary nitroglycerin was given both before and after the angioplasty to treat the vessel spasm.  Lesion length is 15 mm.  Angiomax was used for anticoagulation.  An ACT was used to confirm that the Angiomax was therapeutic. IMPRESSIONS:  1. Successful drug-eluting stent placement into the proximal to mid LAD.  A 3.5 x 20 synergy stem was used post dilated to greater than 4 mm in diameter.  RECOMMENDATION:  The patient will need to be on dual antiplatelet therapy for at least a year.  Continue aggressive secondary prevention and medical therapy for his heart failure.  He will followup with Dr. Mayford Knife.

## 2012-01-13 NOTE — Research (Signed)
EVOLVE II Informed Consent   Subject Name: Patrick Barnes  Subject met inclusion and exclusion criteria.  The informed consent form, study requirements and expectations were reviewed with the subject and questions and concerns were addressed prior to the signing of the consent form.  The subject verbalized understanding of the trail requirements.  The subject agreed to participate in the EVOLVE II research trial and signed the informed consent.  The informed consent was obtained prior to performance of any protocol-specific procedures for the subject.  A copy of the signed informed consent was given to the subject and a copy was placed in the subject's medical record.  Claire Shown 01/13/2012, 11:31 AM

## 2012-01-13 NOTE — Plan of Care (Signed)
Problem: Phase I Progression Outcomes Goal: Dyspnea controlled at rest (HF) Outcome: Completed/Met Date Met:  01/13/12 Pt does not have this problem to begin with.

## 2012-01-13 NOTE — Progress Notes (Addendum)
SUBJECTIVE:  Doing well with no chest pain  OBJECTIVE:   Vitals:   Filed Vitals:   01/12/12 1600 01/12/12 2046 01/13/12 0500 01/13/12 0811  BP: 123/83 117/71 132/83 132/85  Pulse: 52 51 59 51  Temp:  97.5 F (36.4 C) 97.5 F (36.4 C) 97.6 F (36.4 C)  TempSrc:  Oral Oral Oral  Resp:  16 18 17   Height:      Weight:      SpO2:  97% 97% 99%   I&O's:   Intake/Output Summary (Last 24 hours) at 01/13/12 0836 Last data filed at 01/12/12 1807  Gross per 24 hour  Intake    900 ml  Output    300 ml  Net    600 ml   TELEMETRY: Reviewed telemetry pt in sinus bradycardia     PHYSICAL EXAM General: Well developed, well nourished, in no acute distress Head: Eyes PERRLA, No xanthomas.   Normal cephalic and atramatic  Lungs:   Clear bilaterally to auscultation and percussion. Heart:   HRRR S1 S2 Pulses are 2+ & equal.            No carotid bruit. No JVD.  No abdominal bruits. No femoral bruits. Abdomen: Bowel sounds are positive, abdomen soft and non-tender without masses Extremities:   No clubbing, cyanosis or edema.  DP +1 Neuro: Alert and oriented X 3. Psych:  Good affect, responds appropriately   LABS: Basic Metabolic Panel:  Basename 01/13/12 0514 01/12/12 1455  NA 137 138  K 4.3 4.1  CL 105 104  CO2 25 25  GLUCOSE 87 112*  BUN 17 12  CREATININE 0.96 1.00  CALCIUM 8.5 8.3*  MG -- --  PHOS -- --   CBC:  Basename 01/13/12 0514 01/12/12 1455  WBC 6.8 7.0  NEUTROABS -- --  HGB 12.3* 12.3*  HCT 38.5* 37.0*  MCV 96.3 95.6  PLT 212 204   Fasting Lipid Panel:  Basename 01/13/12 0514  CHOL 173  HDL 61  LDLCALC 95  TRIG 85  CHOLHDL 2.8  LDLDIRECT --   Coag Panel:   Lab Results  Component Value Date   INR 1.16 01/12/2012    RADIOLOGY: Ct Chest W Contrast  12/25/2011  *RADIOLOGY REPORT*  Clinical Data: Persistent left lower lobe infiltrate.  CT CHEST WITH CONTRAST  Technique:  Multidetector CT imaging of the chest was performed following the standard  protocol during bolus administration of intravenous contrast.  Contrast: 75mL OMNIPAQUE IOHEXOL 300 MG/ML IV SOLN  Comparison: 12/25/2011 and 12/14/2011 chest x-rays.  No comparison CT.  Findings: Bilateral pulmonary parenchymal changes greater on the left and most notable in the left lung base.  This may represent a combination of infiltrate, atelectasis and scarring.  There has been interval improvement between the two-view chest x-rays available suggesting that the patient is responding to treatment. Clinical correlation and continued treatment recommended with follow-up imaging (preferably unenhanced CT) in 3 months or sooner if clinically indicated.  This would help establish the patient's baseline exam in an attempt to rule out malignancy.  Atypical infection would be difficult to exclude in the proper clinical setting.  Present examination was not performed to optimized to evaluate for pulmonary embolus.  No large central pulmonary embolus is noted. No aortic dissection.  Ascending thoracic aorta measures up to 3.4 cm.  Shotty lymph nodes mediastinal region largest lower pretracheal region measuring 1.1 x 0.9 cm.  Minimal pectus deformity.  Heart slightly enlarged.  Coronary artery calcifications.  Limited imaging  of the upper abdominal structures unremarkable.  No bony destructive lesion.  Mild degenerative changes thoracic spine.  IMPRESSION: Bilateral pulmonary parenchymal changes greater on the left and most notable in the left lung base.  This may represent a combination of infiltrate, atelectasis and scarring.  There has been interval improvement between the two-view chest x-rays available suggesting that the patient is responding to treatment. Clinical correlation and continued treatment recommended with follow-up imaging (preferably unenhanced CT) in 3 months or sooner if clinically indicated.  Please see above.  Original Report Authenticated By: Fuller Canada, M.D.      ASSESSMENT:  1.   Obstructive ASCAD of the proximal LAD 2.  Mixed dilated cardiomyopathy EF 35% by echo 3.  Frequent PVC's 4.  Bradycardia  PLAN:   1.  PCI of LAD today by Dr. Eldridge Dace 2.  Continue ACE I/ASA 3.  No beta blocker due to bradycardia 4.  Will get 2D echo today to assess for LV thrombus and reassess LVF  Quintella Reichert, MD  01/13/2012  8:36 AM

## 2012-01-14 ENCOUNTER — Other Ambulatory Visit: Payer: Self-pay

## 2012-01-14 ENCOUNTER — Encounter (HOSPITAL_COMMUNITY): Payer: Self-pay

## 2012-01-14 DIAGNOSIS — I2510 Atherosclerotic heart disease of native coronary artery without angina pectoris: Secondary | ICD-10-CM | POA: Diagnosis present

## 2012-01-14 LAB — BASIC METABOLIC PANEL
Calcium: 8.5 mg/dL (ref 8.4–10.5)
GFR calc Af Amer: >90 mL/min (ref >90)
GFR calc non Af Amer: 89 mL/min — ABNORMAL LOW (ref >90)
Glucose, Bld: 110 mg/dL — ABNORMAL HIGH (ref 70–99)
Sodium: 137 mEq/L (ref 135–145)

## 2012-01-14 LAB — CBC
MCH: 31.4 pg (ref 26.0–34.0)
MCHC: 32.7 g/dL (ref 30.0–36.0)
Platelets: 204 10*3/uL (ref 150–400)
RDW: 14.7 % (ref 11.5–15.5)

## 2012-01-14 MED ORDER — SIMVASTATIN 20 MG PO TABS: 20.0000 mg | ORAL_TABLET | Freq: Every day | ORAL | Status: DC

## 2012-01-14 MED ORDER — RAMIPRIL 2.5 MG PO CAPS
2.5000 mg | ORAL_CAPSULE | Freq: Every day | ORAL | Status: DC
  Administered 2012-01-14: 2.5 mg via ORAL
  Filled 2012-01-14: qty 1

## 2012-01-14 MED ORDER — ASPIRIN 325 MG PO TBEC: 325.0000 mg | DELAYED_RELEASE_TABLET | Freq: Every day | ORAL | Status: AC

## 2012-01-14 MED ORDER — SIMVASTATIN 20 MG PO TABS
20.0000 mg | ORAL_TABLET | Freq: Every day | ORAL | Status: DC
  Filled 2012-01-14: qty 1

## 2012-01-14 MED ORDER — CLOPIDOGREL BISULFATE 75 MG PO TABS: 75.0000 mg | ORAL_TABLET | Freq: Every day | ORAL | Status: DC

## 2012-01-14 MED FILL — Dextrose Inj 5%: INTRAVENOUS | Qty: 50 | Status: AC

## 2012-01-14 NOTE — Progress Notes (Signed)
SUBJECTIVE:  Doing well post PCI of LAD  OBJECTIVE:   Vitals:   Filed Vitals:   01/14/12 0430 01/14/12 0528 01/14/12 0700 01/14/12 0800  BP: 114/78     Pulse: 55     Temp: 97.7 F (36.5 C)     TempSrc: Oral     Resp: 12  23 12   Height:      Weight:  49.9 kg (110 lb 0.2 oz)    SpO2: 97%      I&O's:   Intake/Output Summary (Last 24 hours) at 01/14/12 0845 Last data filed at 01/13/12 1515  Gross per 24 hour  Intake    420 ml  Output    400 ml  Net     20 ml   TELEMETRY: Reviewed telemetry pt in sinus bradycardia     PHYSICAL EXAM General: Well developed, well nourished, in no acute distress Head: Eyes PERRLA, No xanthomas.   Normal cephalic and atramatic  Lungs:   Clear bilaterally to auscultation and percussion. Heart:   HRRR S1 S2 Pulses are 2+ & equal.            No carotid bruit. No JVD.  No abdominal bruits. No femoral bruits. Abdomen: Bowel sounds are positive, abdomen soft and non-tender without masses  Extremities:   No clubbing, cyanosis or edema.  DP +1 Neuro: Alert and oriented X 3. Psych:  Good affect, responds appropriately   LABS: Basic Metabolic Panel:  Basename 01/14/12 0514 01/13/12 0514  NA 137 137  K 4.1 4.3  CL 103 105  CO2 24 25  GLUCOSE 110* 87  BUN 19 17  CREATININE 0.92 0.96  CALCIUM 8.5 8.5  MG -- --  PHOS -- --   CBC:  Basename 01/14/12 0514 01/13/12 0514  WBC 7.8 6.8  NEUTROABS -- --  HGB 12.9* 12.3*  HCT 39.4 38.5*  MCV 95.9 96.3  PLT 204 212   Cardiac Enzymes:  Basename 01/13/12 2034 01/13/12 1607 01/13/12 0910  CKTOTAL 67 78 70  CKMB 3.6 3.6 4.1*  CKMBINDEX -- -- --  TROPONINI <0.30 <0.30 --   Hemoglobin A1C:  Basename 01/13/12 0910  HGBA1C 5.6   Fasting Lipid Panel:  Basename 01/13/12 0514  CHOL 173  HDL 61  LDLCALC 95  TRIG 85  CHOLHDL 2.8  LDLDIRECT --   Coag Panel:   Lab Results  Component Value Date   INR 1.16 01/12/2012    RADIOLOGY: Ct Chest W Contrast  12/25/2011  *RADIOLOGY REPORT*   Clinical Data: Persistent left lower lobe infiltrate.  CT CHEST WITH CONTRAST  Technique:  Multidetector CT imaging of the chest was performed following the standard protocol during bolus administration of intravenous contrast.  Contrast: 75mL OMNIPAQUE IOHEXOL 300 MG/ML IV SOLN  Comparison: 12/25/2011 and 12/14/2011 chest x-rays.  No comparison CT.  Findings: Bilateral pulmonary parenchymal changes greater on the left and most notable in the left lung base.  This may represent a combination of infiltrate, atelectasis and scarring.  There has been interval improvement between the two-view chest x-rays available suggesting that the patient is responding to treatment. Clinical correlation and continued treatment recommended with follow-up imaging (preferably unenhanced CT) in 3 months or sooner if clinically indicated.  This would help establish the patient's baseline exam in an attempt to rule out malignancy.  Atypical infection would be difficult to exclude in the proper clinical setting.  Present examination was not performed to optimized to evaluate for pulmonary embolus.  No large central pulmonary embolus  is noted. No aortic dissection.  Ascending thoracic aorta measures up to 3.4 cm.  Shotty lymph nodes mediastinal region largest lower pretracheal region measuring 1.1 x 0.9 cm.  Minimal pectus deformity.  Heart slightly enlarged.  Coronary artery calcifications.  Limited imaging of the upper abdominal structures unremarkable.  No bony destructive lesion.  Mild degenerative changes thoracic spine.  IMPRESSION: Bilateral pulmonary parenchymal changes greater on the left and most notable in the left lung base.  This may represent a combination of infiltrate, atelectasis and scarring.  There has been interval improvement between the two-view chest x-rays available suggesting that the patient is responding to treatment. Clinical correlation and continued treatment recommended with follow-up imaging (preferably  unenhanced CT) in 3 months or sooner if clinically indicated.  Please see above.  Original Report Authenticated By: Fuller Canada, M.D.      ASSESSMENT:  1. Obstructive ASCAD of the proximal LAD s/p PCI by Dr. Eldridge Dace - enrolled in [place] II trial 2. Mixed dilated cardiomyopathy EF 35% by echo in office 3. Frequent PVC's  4. Bradycardia - asymptomatic 5.  dyslipidemia  PLAN:   1.  ASA/Plavix 2.  Start simvastatin 20mg  daily 3.  No beta blocker due to bradycardia 4.  Altace 2.5mg  daily 5.  Review results of echo to assess for LV thrombus 6.  D/C home later today  Quintella Reichert, MD  01/14/2012  8:45 AM

## 2012-01-14 NOTE — Progress Notes (Signed)
CARDIAC REHAB PHASE I   PRE:  Rate/Rhythm: 49 SB  BP:  Supine:   Sitting: 118/81  Standing:    SaO2:   MODE:  Ambulation: 680 ft   POST:  Rate/Rhythem: 87 SR  BP:  Supine:   Sitting: 140/87  Standing:    SaO2:  0745-0900 Tolerated ambulation well without c/o of cp or SOB. VS stable. Completed discharge education with pt. He agrees to [employer]. CRP in [place], will send referral.  Beatrix Fetters

## 2012-01-14 NOTE — Discharge Instructions (Signed)
Coronary Angiography With Stent Care After A heart stent is placed to open a narrowed or closed heart (coronary) artery. This allows the heart to get the blood supply it needs. Read the instructions outlined below and refer to this sheet in the next few weeks. These discharge instructions provide you with general information on caring for yourself after you leave the hospital. Your caregiver may also give you specific instructions. If you have any problems or questions after discharge, call your caregiver. HOME CARE INSTRUCTIONS  Medication  Medicine to keep your blood from clotting will likely be prescribed after your stent placement. It is very important to take these medicines. These may include:   Aspirin.   Antiplatelet medicine.   Have a list of all the medicines you will be taking when you leave the hospital. For every medicine, the list should include:   Name.   Exact dose.   Time of day to be taken.   How often it should be taken.   Whether it should be taken with food or on an empty stomach.   Which medicines can or cannot be taken together.   Be sure you understand why you are taking the medicine. Keep a copy of your medicines with you at all times.   Do not add or stop taking any prescribed or over-the-counter medicine until you check with your caregiver.   Medicines can have side effects. Call your caregiver who prescribed the medicine if you:   Notice unusual bleeding or bruising.   Develop a rash.   Have nausea or vomiting.   Have diarrhea or stomach pain.   Feel dizzy or lightheaded.   Feel your heart is skipping beats or is beating too fast or too slow.  Diet  Follow a heart-healthy diet. Diet is very important to heart health.   Eat plenty of fresh fruits and vegetables. Meat should be lean cut.   Avoid the following types of food:   Food that is high in salt.   Canned or highly processed food.   Food that is high in saturated fat or sugar.    Fried food.   Talk to a dietician. He or she can teach you how to read food labels and make healthy food and drink choices.  Lifestyle changes  Important lifestyle changes will be needed to manage heart disease. These include:   Not smoking. If you smoke, quit. Ask your cargiver to help you quit smoking.   Managing your weight. Exercise and lose weight per your caregiver's advice.   Managing your blood pressure. Check your blood pressure daily. Tell your caregiver if your blood pressure is high.   Managing your alcohol intake. Excessive alcohol intake can damage your heart. Limit or do not drink alcohol.   Managing diabetes. Good diabetic control is essential to your heart health.  Incision care  Your incision site may be tender for a few days after your procedure. Tell your caregiver right away if the following occurs:   You have redness, swelling, or a hard lump at the incision site.   You have pain at the incision site.   You have any type of drainage at the incision site.  Surgical and dental procedures  If you need surgery or a dental procedure, tell the caregiver performing the procedure if you are on aspirin or antiplatelet medicine. You should also tell your cardiologist about any upcoming dental or surgical procedures so your aspirin and antiplatelet medicine can be properly managed before   and after the procedure.  Magnetic resonance imaging (MRI)  If you need an MRI after your heart stent was placed, be sure to tell the caregiver who orders the MRI that you have a heart stent.  Bleeding risk  When taking aspirin, antiplatelet, or anticoagulant medicines, you have a higher risk for bleeding. Tell your caregiver right away if you have any of the following:   Bruising on your skin.   Bleeding from your gums.   Bloody mucus after coughing or vomiting.   Blood in your urine.   Bright red blood in your stools or dark, tarry stools.   Nosebleeds.   If you have an  accident or any type of serious trauma, this could cause internal bleeding if you are taking aspirin, antiplatelet, or anticoagulant medicines. Tell your caregiver or seek medical care if you have been in an accident.  SEEK IMMEDIATE MEDICAL CARE IF:   You develop chest pain that does not go away.   You have a severe headache that does not go away.   You develop any type of bleeding or bruising.   You feel faint or pass out.   You develop shortness of breath.   You have chills, feel sick to your stomach (nauseous), or vomit.   You have a fever.  Document Released: 06/05/2005 Document Revised: 07/29/2011 Document Reviewed: 03/23/2011 Exit[employer] Patient Information 2012 [employer]. 

## 2012-01-14 NOTE — Progress Notes (Signed)
  Echocardiogram 2D Echocardiogram has been performed.  Patrick Barnes 01/14/2012, 10:35 AM

## 2012-01-17 NOTE — Discharge Summary (Signed)
Patient ID: Patrick Barnes MRN: [id number] DOB/AGE: 04/15/1951 61 y.o.  Admit date: 01/12/2012 Discharge date: 01/17/2012  Primary Discharge Diagnosis: Coronary artery disease Secondary Discharge Diagnosis    Dilated Cardiomyopathy  PVC's  Hypogonadotropic hypogonadism  Hemorrhoids  Low bone density  Asymptomatic bradycardia    Significant Diagnostic Studies: Cardiac catheterization, 2D echocardiogram  Consults: NONE  Hospital Course:  This is a 60yo WM with a history of PVC's and asymptomatic bradycardia who was initially referred for evaluation of PVC's.  A 2D echo was obtained which showed moderate LV dysfunction with an EF of 35%.   He underwent nuclear stress test which did not show any ischemia but due to his LV dysfunction he was brought to the outpatient cath lab for cath. He underwent cardiac cath on 2/12 showing a high grade long 80% stenosis of the proximal LAD and 30-40% mid LAD with otherwise normal coronary arteries.  He was admitted and underwent PCI of the LAD by Dr. Eldridge Dace on 2/13.  He was entered in the [place] II research trial.  He did well post PCI. A 2D echo was done the next day to reevaluate LVF due to significant smoke in the LV on prior echo to rule out thrombus.  The echo showed mildly reduced LVF with EF 40-45% with no LV thrombus.  He was subsequently discharged to home in stable condition.   Discharge Exam:  Well developed, well nourished white male in no acute distress HEENT:  Benign NECK:  Supple, no LAD, no bruit LUNGS:  CTA bilaterally COR:  RRR, no M/R/G ABD:  Soft, NT, ND active BS EXT:  No C/E/E, no hematoma at prior cath site in right groin  Labs:   Lab Results  Component Value Date   WBC 7.8 01/14/2012   HGB 12.9* 01/14/2012   HCT 39.4 01/14/2012   MCV 95.9 01/14/2012   PLT 204 01/14/2012    Lab 01/14/12 0514  NA 137  K 4.1  CL 103  CO2 24  BUN 19  CREATININE 0.92  CALCIUM 8.5  PROT --  BILITOT --  ALKPHOS --  ALT --  AST --    GLUCOSE 110*   Lab Results  Component Value Date   CKTOTAL 67 01/13/2012   CKMB 3.6 01/13/2012   TROPONINI <0.30 01/13/2012    Lab Results  Component Value Date   CHOL 173 01/13/2012   Lab Results  Component Value Date   HDL 61 01/13/2012   Lab Results  Component Value Date   LDLCALC 95 01/13/2012   Lab Results  Component Value Date   TRIG 85 01/13/2012   Lab Results  Component Value Date   CHOLHDL 2.8 01/13/2012   No results found for this basename: LDLDIRECT      Radiology:  none EKG:  Sinus bradycardia  FOLLOW UP PLANS AND APPOINTMENTS Discharge Orders    Future Appointments: Provider: Department: Dept Phone: Center:   03/25/2012 8:30 AM [facility] [phone] [facility]     Future Orders Please Complete By Expires   Diet - low sodium heart healthy      Increase activity slowly      Lifting restrictions      Comments:   No heavy lifting more than 10 pounds for 1 week.  May go back to work on 01/18/2012 but no lifting more than 10 pounds for 1 week from today.   Driving Restrictions      Comments:   No driving for 24 hours  Call MD for:  temperature >100.4      Call MD for:  severe uncontrolled pain      Call MD for:  persistant dizziness or light-headedness      Call MD for:  difficulty breathing, headache or visual disturbances        Medication List  As of 01/17/2012  8:25 AM   TAKE these medications         aspirin 325 MG EC tablet   Take 1 tablet (325 mg total) by mouth daily.      clopidogrel 75 MG tablet   Commonly known as: PLAVIX   Take 1 tablet (75 mg total) by mouth daily with breakfast.      ramipril 2.5 MG capsule   Commonly known as: ALTACE   Take 2.5 mg by mouth every morning.      simvastatin 20 MG tablet   Commonly known as: ZOCOR   Take 1 tablet (20 mg total) by mouth daily at 6 PM.      Testosterone 20.25 MG/ACT (1.62%) Gel   Apply 1 application topically every morning. To shoulders      Vitamin D  (Ergocalciferol) 50000 UNITS Caps   Commonly known as: DRISDOL   Take 50,000 Units by mouth every 7 (seven) days. On Saturday      VITAMIN D BOOSTER PO   Take 1 tablet by mouth daily.      ZOLOFT PO   Take 50 mg by mouth every morning.           Follow-up Information    Follow up with FERGUSON,CYNTHIA A, NP on 01/28/2012. (at 1:30pm)    Contact information:   [facility] [address] [place] [address] [phone]          BRING ALL MEDICATIONS WITH YOU TO FOLLOW UP APPOINTMENTS  Time spent with patient to include physician time:  35 minutes Signed: Cortez Flippen R 01/17/2012, 8:25 AM

## 2012-02-08 ENCOUNTER — Other Ambulatory Visit: Payer: Self-pay | Admitting: *Deleted

## 2012-02-18 ENCOUNTER — Encounter (HOSPITAL_COMMUNITY): Payer: Self-pay

## 2012-02-18 ENCOUNTER — Encounter (HOSPITAL_COMMUNITY)
Admission: RE | Admit: 2012-02-18 | Discharge: 2012-02-18 | Disposition: A | Discharge status: Home / Self Care | Payer: Federal, State, Local not specified - PPO | Source: Ambulatory Visit | Attending: Cardiology | Admitting: Cardiology

## 2012-02-18 NOTE — Progress Notes (Signed)
Cardiac Rehab Medication Review by a Pharmacist  Does the patient  feel that his/her medications are working for him/her?  yes  Has the patient been experiencing any side effects to the medications prescribed?  no  Does the patient measure his/her own blood pressure or blood glucose at home?  yes   Does the patient have any problems obtaining medications due to transportation or finances?   Yes -difficulty having androgel last full time   Understanding of regimen: good Understanding of indications: fair Potential of compliance: good - uses "routine" to remember to take medications; may occasionally miss 6 pm simvastatin but generally remembers to take after supper    Pharmacist comments: recommended changing statin time to bedtime or even in the morning if it helps to remember to take it.    Concha Norway 02/18/2012 8:55 AM

## 2012-02-22 ENCOUNTER — Encounter (HOSPITAL_COMMUNITY): Payer: Federal, State, Local not specified - PPO

## 2012-02-24 ENCOUNTER — Encounter (HOSPITAL_COMMUNITY): Payer: Federal, State, Local not specified - PPO

## 2012-02-24 NOTE — Progress Notes (Signed)
Patrick Barnes 61 y.o. male       Nutrition Screen                                                                    YES  NO Do you live in a nursing home?  X   Do you eat out more than 3 times/week?   X  If yes, how many times per week do you eat out? 4  Do you have food allergies?   X If yes, what are you allergic to?  Have you gained or lost more than 10 lbs without trying?               X If yes, how much weight have you lost and over what time period?  lbs gained or lost over  weeks/month  Do you want to lose weight?     X If yes, what is a goal weight or amount of weight you would like to lose?  lb  Do you eat alone most of the time?   X   Do you eat less than 2 meals/day?  X If yes, how many meals do you eat?  Do you drink more than 3 alcohol drinks/day?  X If yes, how many drinks per day?  Are you having trouble with constipation? *  X If yes, what are you doing to help relieve constipation?  Do you have financial difficulties with buying food?*    X   Are you experiencing regular nausea/ vomiting?*     X   Do you have a poor appetite? *                                        X   Do you have trouble chewing/swallowing? *   X    Pt with diagnoses of:  X Stent/ PTCA X Dyslipidemia  / HDL< 40 / LDL>70 / High TG      X %  Body fat >goal / Body Mass Index >25 XCHF        Pt Risk Score   1       Diagnosis Risk Score  65       Total Risk Score   66                        X High Risk                Low Risk    HT: 66.25" Ht Readings from Last 1 Encounters:  02/18/12 5' 6.25" (1.683 m)    WT:   118.1 lb (53.7 kg) Wt Readings from Last 3 Encounters:  02/18/12 118 lb 6.2 oz (53.7 kg)  01/14/12 110 lb 0.2 oz (49.9 kg)  01/14/12 110 lb 0.2 oz (49.9 kg)     IBW 65.2 82%IBW BMI 19.0 20.3%body fat  Meds reviewed: Vitamin D Past Medical History  Diagnosis Date  . Asymptomatic PVCs   . Low testosterone   . Vitamin d deficiency   . Anxiety   . Dysrhythmia   . Dilated  cardiomyopathy    Activity level: Pt is  active     Wt goal: 118 lb ( 53.7 kg) Current tobacco use? No      Food/Drug Interaction? No       Labs:  Lipid Panel     Component Value Date/Time   CHOL 173 01/13/2012 0514   TRIG 85 01/13/2012 0514   HDL 61 01/13/2012 0514   CHOLHDL 2.8 01/13/2012 0514   VLDL 17 01/13/2012 0514   LDLCALC 95 01/13/2012 0514   Lab Results  Component Value Date   HGBA1C 5.6 01/13/2012   01/14/12 Glucose 110  LDL goal: < 100       MI, DM, Carotid or PVD and > 2:      > 61 yo male Estimated Daily Nutrition Needs for: ? wt maintenance 1950-2200 Kcal , Total Fat 60-70gm, Saturated Fat 14-17 gm, Trans Fat 2.1-2.4 gm,  Sodium less than 1500 mg

## 2012-02-26 ENCOUNTER — Encounter (HOSPITAL_COMMUNITY): Payer: Federal, State, Local not specified - PPO

## 2012-02-29 ENCOUNTER — Encounter (HOSPITAL_COMMUNITY): Payer: Federal, State, Local not specified - PPO

## 2012-03-02 ENCOUNTER — Encounter (HOSPITAL_COMMUNITY): Payer: Federal, State, Local not specified - PPO

## 2012-03-04 ENCOUNTER — Encounter (HOSPITAL_COMMUNITY): Payer: Federal, State, Local not specified - PPO

## 2012-03-07 ENCOUNTER — Encounter (HOSPITAL_COMMUNITY): Payer: Federal, State, Local not specified - PPO

## 2012-03-09 ENCOUNTER — Encounter (HOSPITAL_COMMUNITY): Payer: Federal, State, Local not specified - PPO

## 2012-03-11 ENCOUNTER — Encounter (HOSPITAL_COMMUNITY): Payer: Federal, State, Local not specified - PPO

## 2012-03-14 ENCOUNTER — Encounter (HOSPITAL_COMMUNITY): Payer: Federal, State, Local not specified - PPO

## 2012-03-16 ENCOUNTER — Encounter (HOSPITAL_COMMUNITY): Payer: Federal, State, Local not specified - PPO

## 2012-03-18 ENCOUNTER — Encounter (HOSPITAL_COMMUNITY): Payer: Federal, State, Local not specified - PPO

## 2012-03-21 ENCOUNTER — Encounter (HOSPITAL_COMMUNITY): Payer: Federal, State, Local not specified - PPO

## 2012-03-23 ENCOUNTER — Encounter (HOSPITAL_COMMUNITY): Payer: Federal, State, Local not specified - PPO

## 2012-03-25 ENCOUNTER — Ambulatory Visit
Admission: RE | Admit: 2012-03-25 | Discharge: 2012-03-25 | Disposition: A | Discharge status: Home / Self Care | Payer: Federal, State, Local not specified - PPO | Source: Ambulatory Visit | Attending: Family Medicine | Admitting: Family Medicine

## 2012-03-25 ENCOUNTER — Encounter (HOSPITAL_COMMUNITY): Payer: Federal, State, Local not specified - PPO

## 2012-03-25 DIAGNOSIS — R9389 Abnormal findings on diagnostic imaging of other specified body structures: Secondary | ICD-10-CM

## 2012-03-28 ENCOUNTER — Encounter (HOSPITAL_COMMUNITY): Payer: Federal, State, Local not specified - PPO

## 2012-03-30 ENCOUNTER — Encounter (HOSPITAL_COMMUNITY): Payer: Federal, State, Local not specified - PPO

## 2012-04-01 ENCOUNTER — Encounter (HOSPITAL_COMMUNITY): Payer: Federal, State, Local not specified - PPO

## 2012-04-04 ENCOUNTER — Encounter (HOSPITAL_COMMUNITY): Payer: Federal, State, Local not specified - PPO

## 2012-04-06 ENCOUNTER — Encounter (HOSPITAL_COMMUNITY): Payer: Federal, State, Local not specified - PPO

## 2012-04-08 ENCOUNTER — Encounter (HOSPITAL_COMMUNITY): Payer: Federal, State, Local not specified - PPO

## 2012-04-11 ENCOUNTER — Encounter (HOSPITAL_COMMUNITY): Payer: Federal, State, Local not specified - PPO

## 2012-04-13 ENCOUNTER — Encounter (HOSPITAL_COMMUNITY): Payer: Federal, State, Local not specified - PPO

## 2012-04-15 ENCOUNTER — Encounter (HOSPITAL_COMMUNITY): Payer: Federal, State, Local not specified - PPO

## 2012-04-18 ENCOUNTER — Encounter (HOSPITAL_COMMUNITY): Payer: Federal, State, Local not specified - PPO

## 2012-04-20 ENCOUNTER — Encounter (HOSPITAL_COMMUNITY): Payer: Federal, State, Local not specified - PPO

## 2012-04-22 ENCOUNTER — Encounter (HOSPITAL_COMMUNITY): Payer: Federal, State, Local not specified - PPO

## 2012-04-25 ENCOUNTER — Encounter (HOSPITAL_COMMUNITY): Payer: Federal, State, Local not specified - PPO

## 2012-04-27 ENCOUNTER — Encounter (HOSPITAL_COMMUNITY): Payer: Federal, State, Local not specified - PPO

## 2012-04-28 ENCOUNTER — Encounter: Payer: Self-pay | Admitting: Internal Medicine

## 2012-04-29 ENCOUNTER — Encounter (HOSPITAL_COMMUNITY): Payer: Federal, State, Local not specified - PPO

## 2012-05-02 ENCOUNTER — Encounter (HOSPITAL_COMMUNITY): Payer: Federal, State, Local not specified - PPO

## 2012-05-04 ENCOUNTER — Encounter (HOSPITAL_COMMUNITY): Payer: Federal, State, Local not specified - PPO

## 2012-05-06 ENCOUNTER — Encounter (HOSPITAL_COMMUNITY): Payer: Federal, State, Local not specified - PPO

## 2012-05-09 ENCOUNTER — Encounter (HOSPITAL_COMMUNITY): Payer: Federal, State, Local not specified - PPO

## 2012-05-11 ENCOUNTER — Encounter (HOSPITAL_COMMUNITY): Payer: Federal, State, Local not specified - PPO

## 2012-05-13 ENCOUNTER — Encounter (HOSPITAL_COMMUNITY): Payer: Federal, State, Local not specified - PPO

## 2012-05-16 ENCOUNTER — Encounter (HOSPITAL_COMMUNITY): Payer: Federal, State, Local not specified - PPO

## 2012-05-18 ENCOUNTER — Encounter (HOSPITAL_COMMUNITY): Payer: Federal, State, Local not specified - PPO

## 2012-05-20 ENCOUNTER — Encounter (HOSPITAL_COMMUNITY): Payer: Federal, State, Local not specified - PPO

## 2012-05-23 ENCOUNTER — Encounter (HOSPITAL_COMMUNITY): Payer: Federal, State, Local not specified - PPO

## 2012-05-25 ENCOUNTER — Encounter (HOSPITAL_COMMUNITY): Payer: Federal, State, Local not specified - PPO

## 2012-05-27 ENCOUNTER — Encounter (HOSPITAL_COMMUNITY): Payer: Federal, State, Local not specified - PPO

## 2012-06-01 ENCOUNTER — Institutional Professional Consult (permissible substitution): Payer: Self-pay | Admitting: Internal Medicine

## 2012-07-18 ENCOUNTER — Encounter: Payer: Self-pay | Admitting: *Deleted

## 2012-07-18 ENCOUNTER — Ambulatory Visit (INDEPENDENT_AMBULATORY_CARE_PROVIDER_SITE_OTHER): Payer: Federal, State, Local not specified - PPO | Admitting: Internal Medicine

## 2012-07-18 ENCOUNTER — Encounter: Payer: Self-pay | Admitting: Internal Medicine

## 2012-07-18 VITALS — BP 136/86 | HR 56 | Ht 66.0 in | Wt 122.0 lb

## 2012-07-18 DIAGNOSIS — I4949 Other premature depolarization: Secondary | ICD-10-CM

## 2012-07-18 DIAGNOSIS — I493 Ventricular premature depolarization: Secondary | ICD-10-CM

## 2012-07-18 DIAGNOSIS — I428 Other cardiomyopathies: Secondary | ICD-10-CM

## 2012-07-18 DIAGNOSIS — I420 Dilated cardiomyopathy: Secondary | ICD-10-CM
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
## 2012-07-18 NOTE — Assessment & Plan Note (Signed)
As above Class 1 symptoms

## 2012-07-18 NOTE — Assessment & Plan Note (Signed)
He has a huge burden of PVCs approximately 40% and in the setting of his cardiomyopathy and his bradycardia control of his ectopy I think is important. He is not inclined to medication. He is on an ACE inhibitor appropriately. He has bradycardia which minimizes the amount of beta blockers that he can take. We discussed the role of catheter ablation. He would like to proceed that way. We discussed potential benefits as well as potential risks including perforation. He understands. We will plan to schedule him with Dr. Johney Frame. I will defer to Dr. Johney Frame as to whether he would like to see him as an outpatient prior to proceeding. The patient like to proceed expeditiously

## 2012-07-25 ENCOUNTER — Telehealth: Payer: Self-pay | Admitting: Internal Medicine

## 2012-07-25 NOTE — Telephone Encounter (Signed)
Patient S wife called because she had the impression that Patrick Barnes was going to call last Tuesday to schedule a catheter ablation, and 0schedule .have not heard from her . On [name] note on 07/18/12 it reads, that MD will defer to [name] as to whether he would to see pt prior to procedure. Per [name], pt does not need to be seen as outpatient  prior scheduling the ablation. Pt and wife are aware that Patrick Barnes will call this week to schedule this procedure.

## 2012-07-25 NOTE — Telephone Encounter (Signed)
Pt calling re procedure dr Graciela Husbands told him kelly would call to set up with allred, not heard anything yet and following up on it

## 2012-07-26 ENCOUNTER — Other Ambulatory Visit: Payer: Self-pay | Admitting: *Deleted

## 2012-07-26 ENCOUNTER — Encounter: Payer: Self-pay | Admitting: *Deleted

## 2012-07-26 DIAGNOSIS — Z01812 Encounter for preprocedural laboratory examination: Secondary | ICD-10-CM

## 2012-07-26 DIAGNOSIS — I472 Ventricular tachycardia: Secondary | ICD-10-CM

## 2012-07-26 NOTE — Telephone Encounter (Signed)
Spoke with Pt's wife, his procedure will be 08/11/12 and he will come in for labs on 08/03/12. He will be sent a letter with more information and was told if he doesn't receive it then let us know when he comes for labs.

## 2012-07-27 ENCOUNTER — Institutional Professional Consult (permissible substitution): Payer: Self-pay | Admitting: Internal Medicine

## 2012-08-02 ENCOUNTER — Encounter (HOSPITAL_COMMUNITY): Payer: Self-pay | Admitting: Pharmacy Technician

## 2012-08-03 ENCOUNTER — Other Ambulatory Visit (INDEPENDENT_AMBULATORY_CARE_PROVIDER_SITE_OTHER): Payer: Federal, State, Local not specified - PPO

## 2012-08-03 DIAGNOSIS — Z01812 Encounter for preprocedural laboratory examination: Secondary | ICD-10-CM

## 2012-08-03 DIAGNOSIS — I472 Ventricular tachycardia: Secondary | ICD-10-CM

## 2012-08-03 LAB — CBC WITH DIFFERENTIAL/PLATELET
Basophils Relative: 0.2 % (ref 0.0–3.0)
Eosinophils Absolute: 0 10*3/uL (ref 0.0–0.7)
HCT: 37.6 % — ABNORMAL LOW (ref 39.0–52.0)
Hemoglobin: 12.4 g/dL — ABNORMAL LOW (ref 13.0–17.0)
Lymphs Abs: 0.8 10*3/uL (ref 0.7–4.0)
MCHC: 32.9 g/dL (ref 30.0–36.0)
MCV: 96.2 fl (ref 78.0–100.0)
Monocytes Absolute: 0.4 10*3/uL (ref 0.1–1.0)
Neutro Abs: 6 10*3/uL (ref 1.4–7.7)
Neutrophils Relative %: 82.9 % — ABNORMAL HIGH (ref 43.0–77.0)
RBC: 3.91 Mil/uL — ABNORMAL LOW (ref 4.22–5.81)

## 2012-08-03 LAB — BASIC METABOLIC PANEL
CO2: 24 mEq/L (ref 19–32)
Chloride: 98 mEq/L (ref 96–112)
Creatinine, Ser: 0.9 mg/dL (ref 0.4–1.5)

## 2012-08-08 ENCOUNTER — Telehealth: Payer: Self-pay | Admitting: Internal Medicine

## 2012-08-08 NOTE — Telephone Encounter (Signed)
Pt to have an ablation 9-12, pt on plavix does he need to d/c [phone]

## 2012-08-08 NOTE — Telephone Encounter (Signed)
Discussed with Dr Johney Frame patient does not have to stop Plavix.  lmom for patient with the above

## 2012-08-09 ENCOUNTER — Telehealth: Payer: Self-pay | Admitting: Internal Medicine

## 2012-08-09 NOTE — Telephone Encounter (Signed)
Pt's wife has question re pt's procedure

## 2012-08-09 NOTE — Telephone Encounter (Signed)
Follow-up:    Patient returned your call.  Please call back. 

## 2012-08-09 NOTE — Telephone Encounter (Signed)
Pt's wife calling re questions she has re procedure

## 2012-08-09 NOTE — Telephone Encounter (Signed)
lmom for patient to return my call 

## 2012-08-09 NOTE — Telephone Encounter (Signed)
Do not hold Plavix per Dr Johney Frame

## 2012-08-11 ENCOUNTER — Ambulatory Visit (HOSPITAL_COMMUNITY)
Admission: RE | Admit: 2012-08-11 | Discharge: 2012-08-12 | Disposition: A | Discharge status: Home / Self Care | Payer: Federal, State, Local not specified - PPO | Source: Ambulatory Visit | Attending: Internal Medicine | Admitting: Internal Medicine

## 2012-08-11 ENCOUNTER — Encounter (HOSPITAL_COMMUNITY): Payer: Self-pay | Admitting: Cardiology

## 2012-08-11 ENCOUNTER — Encounter (HOSPITAL_COMMUNITY)
Admission: RE | Disposition: A | Discharge status: Home / Self Care | Payer: Self-pay | Source: Ambulatory Visit | Attending: Internal Medicine

## 2012-08-11 DIAGNOSIS — E559 Vitamin D deficiency, unspecified: Secondary | ICD-10-CM | POA: Insufficient documentation

## 2012-08-11 DIAGNOSIS — I428 Other cardiomyopathies: Secondary | ICD-10-CM | POA: Insufficient documentation

## 2012-08-11 DIAGNOSIS — I4949 Other premature depolarization: Secondary | ICD-10-CM | POA: Insufficient documentation

## 2012-08-11 DIAGNOSIS — I493 Ventricular premature depolarization: Secondary | ICD-10-CM

## 2012-08-11 DIAGNOSIS — F411 Generalized anxiety disorder: Secondary | ICD-10-CM | POA: Insufficient documentation

## 2012-08-11 DIAGNOSIS — I2510 Atherosclerotic heart disease of native coronary artery without angina pectoris: Secondary | ICD-10-CM

## 2012-08-11 DIAGNOSIS — I420 Dilated cardiomyopathy: Secondary | ICD-10-CM

## 2012-08-11 DIAGNOSIS — E785 Hyperlipidemia, unspecified: Secondary | ICD-10-CM | POA: Insufficient documentation

## 2012-08-11 HISTORY — PX: V-TACH ABLATION: SHX5498

## 2012-08-11 SURGERY — V-TACH ABLATION
Anesthesia: LOCAL

## 2012-08-11 MED ORDER — SODIUM CHLORIDE 0.9 % IJ SOLN: 3.0000 mL | INTRAMUSCULAR | Status: DC | PRN

## 2012-08-11 MED ORDER — ONDANSETRON HCL 4 MG/2ML IJ SOLN: 4.0000 mg | Freq: Four times a day (QID) | INTRAMUSCULAR | Status: DC | PRN

## 2012-08-11 MED ORDER — ACETAMINOPHEN 325 MG PO TABS
650.0000 mg | ORAL_TABLET | ORAL | Status: DC | PRN
  Administered 2012-08-11 (×2): 650 mg via ORAL
  Filled 2012-08-11 (×2): qty 2

## 2012-08-11 MED ORDER — SODIUM CHLORIDE 0.9 % IJ SOLN
3.0000 mL | Freq: Two times a day (BID) | INTRAMUSCULAR | Status: DC
  Administered 2012-08-11 (×2): 3 mL via INTRAVENOUS

## 2012-08-11 MED ORDER — CLOPIDOGREL BISULFATE 75 MG PO TABS
75.0000 mg | ORAL_TABLET | Freq: Every day | ORAL | Status: DC
  Administered 2012-08-12: 75 mg via ORAL
  Filled 2012-08-11 (×2): qty 1

## 2012-08-11 MED ORDER — ASPIRIN 325 MG PO TABS
325.0000 mg | ORAL_TABLET | Freq: Every day | ORAL | Status: DC
  Administered 2012-08-11 – 2012-08-12 (×2): 325 mg via ORAL
  Filled 2012-08-11 (×2): qty 1

## 2012-08-11 MED ORDER — HYDROXYUREA 500 MG PO CAPS
ORAL_CAPSULE | ORAL | Status: AC
  Filled 2012-08-11: qty 1

## 2012-08-11 MED ORDER — HEPARIN SODIUM (PORCINE) 1000 UNIT/ML IJ SOLN
INTRAMUSCULAR | Status: AC
  Filled 2012-08-11: qty 1

## 2012-08-11 MED ORDER — SERTRALINE HCL 50 MG PO TABS
50.0000 mg | ORAL_TABLET | Freq: Every day | ORAL | Status: DC
  Administered 2012-08-11 – 2012-08-12 (×2): 50 mg via ORAL
  Filled 2012-08-11 (×2): qty 1

## 2012-08-11 MED ORDER — HYDROCODONE-ACETAMINOPHEN 5-325 MG PO TABS: 1.0000 | ORAL_TABLET | ORAL | Status: DC | PRN

## 2012-08-11 MED ORDER — SODIUM CHLORIDE 0.9 % IV SOLN: 250.0000 mL | INTRAVENOUS | Status: DC | PRN

## 2012-08-11 MED ORDER — FENTANYL CITRATE 0.05 MG/ML IJ SOLN
INTRAMUSCULAR | Status: AC
  Filled 2012-08-11: qty 2

## 2012-08-11 MED ORDER — BUPIVACAINE HCL (PF) 0.25 % IJ SOLN
INTRAMUSCULAR | Status: AC
  Filled 2012-08-11: qty 60

## 2012-08-11 MED ORDER — SIMVASTATIN 20 MG PO TABS
20.0000 mg | ORAL_TABLET | Freq: Every day | ORAL | Status: DC
  Administered 2012-08-11: 20 mg via ORAL
  Filled 2012-08-11 (×2): qty 1

## 2012-08-11 MED ORDER — MIDAZOLAM HCL 5 MG/5ML IJ SOLN
INTRAMUSCULAR | Status: AC
  Filled 2012-08-11: qty 5

## 2012-08-11 MED ORDER — RAMIPRIL 5 MG PO CAPS
5.0000 mg | ORAL_CAPSULE | Freq: Every day | ORAL | Status: DC
  Administered 2012-08-11 – 2012-08-12 (×2): 5 mg via ORAL
  Filled 2012-08-11 (×2): qty 1

## 2012-08-11 NOTE — Interval H&P Note (Signed)
History and Physical Interval Note:  08/11/2012 10:52 AM  Patrick Barnes  has presented today for surgery, with the diagnosis of VT  The various methods of treatment have been discussed with the patient and family. After consideration of risks, benefits and other options for treatment, the patient has consented to  Procedure(s) (LRB) with comments: V-TACH ABLATION (N/A) as a surgical intervention .  The patient's history has been reviewed, patient examined, no change in status, stable for surgery.  I have reviewed the patient's chart and labs.  Questions were answered to the patient's satisfaction.     Hillis Range  Therapeutic strategies for premature ventricular contractions including medicine and ablation were discussed in detail with the patient today. Risk, benefits, and alternatives to EP study and radiofrequency ablation were also discussed in detail today. These risks include but are not limited to stroke, bleeding, vascular damage, tamponade, perforation, damage to the heart and other structures, AV block requiring pacemaker, worsening renal function, and death. The patient understands these risk and wishes to proceed.  We will therefore proceed with catheter ablation at this time.  Fayrene Fearing Oakes Mccready,MD

## 2012-08-11 NOTE — Brief Op Note (Signed)
08/11/2012  2:01 PM  PATIENT:  Patrick Barnes  61 y.o. male  PRE-OPERATIVE DIAGNOSIS:  PVCs  POST-OPERATIVE DIAGNOSIS:  RVOT PVCs  PROCEDURE:  Procedure(s) (LRB) with comments: V-TACH ABLATION (N/A)  SURGEON:  Surgeon(s) and Role:    * Hillis Range, MD - Primary  PHYSICIAN ASSISTANT:   ASSISTANTS: none   ANESTHESIA:   IV sedation  EBL:     BLOOD ADMINISTERED:none  DRAINS: none   LOCAL MEDICATIONS USED:  LIDOCAINE   SPECIMEN:  No Specimen  DISPOSITION OF SPECIMEN:  N/A  COUNTS:  YES  TOURNIQUET:  * No tourniquets in log *  DICTATION: .Other Dictation: Dictation Number [id number]  PLAN OF CARE: Admit for overnight observation  PATIENT DISPOSITION:  PACU - hemodynamically stable.   Delay start of Pharmacological VTE agent (>24hrs) due to surgical blood loss or risk of bleeding: yes
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
## 2012-08-12 DIAGNOSIS — I4949 Other premature depolarization: Secondary | ICD-10-CM

## 2012-08-12 DIAGNOSIS — I2510 Atherosclerotic heart disease of native coronary artery without angina pectoris: Secondary | ICD-10-CM

## 2012-08-12 HISTORY — PX: OTHER SURGICAL HISTORY: SHX169

## 2012-08-12 NOTE — Progress Notes (Signed)
D/c instructions provided along with medlist. Right groin dry/intact with no bleeding or hematoma noted. IV d/c'd with catheter intact. Patient refused transport to main lobby via wheelchair. Patient ambulated with family. Mamie Levers

## 2012-08-12 NOTE — Progress Notes (Signed)
   SUBJECTIVE: The patient is doing well today sp ablation.  At this time, he denies chest pain, shortness of breath, or any new concerns.     [name] Kitchen aspirin  325 mg Oral Daily  . bupivacaine      . clopidogrel  75 mg Oral Q breakfast  . fentaNYL      . heparin      . hydroxyurea      . midazolam      . ramipril  5 mg Oral Daily  . sertraline  50 mg Oral Daily  . simvastatin  20 mg Oral q1800  . sodium chloride  3 mL Intravenous Q12H      OBJECTIVE: Physical Exam: Filed Vitals:   08/11/12 1736 08/11/12 1830 08/11/12 1949 08/12/12 0658  BP: 142/80 133/83 132/82 104/75  Pulse: 49 49 47 46  Temp:   97.8 F (36.6 C) 98.2 F (36.8 C)  TempSrc:   Oral Oral  Resp: 18 18 18 17   Height:      Weight:      SpO2:   99% 99%    Intake/Output Summary (Last 24 hours) at 08/12/12 0829 Last data filed at 08/11/12 1949  Gross per 24 hour  Intake    603 ml  Output      0 ml  Net    603 ml    Telemetry reveals sinus rhythm with rare PVCs  GEN- The patient is well appearing, alert and oriented x 3 today.   Head- normocephalic, atraumatic Eyes-  Sclera clear, conjunctiva pink Ears- hearing intact Oropharynx- clear Neck- supple, no JVP Lymph- no cervical lymphadenopathy Lungs- Clear to ausculation bilaterally, normal work of breathing Heart- Regular rate and rhythm, no murmurs, rubs or gallops, PMI not laterally displaced GI- soft, NT, ND, + BS Extremities- no clubbing, cyanosis, or edema, no hematoma/ bruit   ASSESSMENT AND PLAN:  Active Problems:  Dilated cardiomyopathy  PVC (premature ventricular contraction)  CAD (coronary artery disease)  1. PVCs- doing very well s/p ablation with only rare PVCs Routine wound care  2. CAD- no ischemic symptoms  3. Bradycardia- asymptomatic  DC to home Follow-up with me in 4-6 weeks   Hillis Range, MD 08/12/2012 8:29 AM

## 2012-08-12 NOTE — Op Note (Signed)
NAMEMarland Kitchen  LEVIE, WAGES NO.:  [id number]  MEDICAL RECORD NO.:  [id number]  LOCATION:  2011                         FACILITY:  [facility]  PHYSICIAN:  Hillis Range, MD       DATE OF BIRTH:  May 09, 1951  DATE OF PROCEDURE: DATE OF DISCHARGE:                              OPERATIVE REPORT   SURGEON:  Hillis Range, MD  PREPROCEDURE DIAGNOSIS:  Frequent premature ventricular contractions.  POSTPROCEDURE DIAGNOSIS:  Right ventricular outflow tract premature ventricular contractions.  PROCEDURES: 1. Comprehensive EP study. 2. Coronary sinus pacing and recording. 3. Three-dimensional mapping of ventricular tachycardia. 4. Radiofrequency ablation of ventricular tachycardia. 5. Isoproterenol infusion. 6. Arrhythmia induction with pacing.  INTRODUCTION:  Mr. Stites is a pleasant 61 year old gentleman with a history of symptomatic recurrent premature ventricular contractions.  He has had more than 47,000 PVCs documented on a Holter monitor.  He has a nonischemic cardiomyopathy, which is felt to likely be due to his very frequent and incessant PVCs.  He has been treated with medical therapy; however, this has been limited by bradycardia.  He therefore presents today for EP study and radiofrequency ablation.  DESCRIPTION OF PROCEDURE:  Informed written consent was obtained and the patient was brought to the electrophysiology lab in a fasting state.  He was adequately sedated with intravenous Versed and fentanyl as outlined in the nursing report.  The patient's right groin was prepped and draped in the usual sterile fashion by the EP lab staff.  Using a percutaneous Seldinger technique, one 6, one 7, and one 8-French hemostasis sheaths were placed into the right common femoral vein.  A 7-French Biosense Webster decapolar coronary sinus catheter was introduced through the right common femoral vein and advanced into the coronary sinus for recording and pacing from this location.   A 6-French quadripolar Josephson catheter was introduced through the right common femoral vein and advanced into the right ventricle for recording and pacing.  This catheter was then pulled back to the His bundle location.  The patient presented to the electrophysiology lab in sinus rhythm with frequent premature ventricular contractions today.  His PR interval measured 180 msec with a QRS duration of 85 msec and a QT interval of 470 msec.  His average RR interval was 1394 msec.  His AH interval measured 80 msec with an HV interval of 48 msec.  He was observed to have 2 separate PVCs, which were both rather dominant today.  The 1st PVC was of a left bundle-branch, left inferior axis with a QRS duration of 173 msec.  The 2nd PVC which actually seemed to be more prevalent at times was of a left bundle-branch inferior axis with only a very small R-wave in lead 1.  The QRS duration of this PVC was 182 msec.  Ventricular pacing was performed, which revealed concentric decremental VA conduction with a VA Wenckebach cycle length of 510 msec.  Rapid atrial pacing was performed, which revealed no evidence of PR greater than RR.  The AV Wenckebach cycle length was 490 msec.  Rapid atrial pacing was continued down to a cycle length of 450 msec with no arrhythmias observed.  Isoproterenol was then infused at  1 mcg/minute to promote PVCs today.  I elected to perform three-dimensional mapping and ablation of the patient's ventricular tachycardia.  A 7-French Biosense Webster 4-mm ablation catheter was introduced through the right common femoral vein and advanced into the right ventricle.  Three-dimensional electroanatomical mapping of the 1st PVC was performed using the [employer]. This demonstrated that the PVC arose from a focal site within the distal 3rd of the right ventricular outflow tract and a posterior free wall position.  Pacing in this location revealed an 11/12 pace map match,  and the distal ablation electrode was 26 msec presystolic in this location. During radiofrequency ablation, incessant ventricular tachycardia with the same morphology was observed initially as a flooring which then terminated.  Radiofrequency current was delivered at a target temperature of 50 degrees at 40 watts for 120 seconds.  A 2nd radiofrequency application was delivered in this same location as a bonus burn.  Attention was then turned to the 2nd PVC focus, which was described above.  Three-dimensional mapping was performed, which revealed that this PVC arose from the anteroseptal right ventricular outflow tract.  This was noted to be in a very anterior position.  In this location, the local activation proceeded the surface QRS of the PVC by 30 msec.  A 12/12 pace map match was observed.  A series of 7 radiofrequency applications were delivered in this location with a target temperature of 50 degrees at 40 watts up to 120 seconds each. Following ablation, the patient was observed for 20 minutes with only very rare PVCs observed.  Isoproterenol was therefore discontinued. Following ablation, the AH interval measured 79 msec with an HV interval of 50 msec.  The procedure was therefore considered completed.  All catheters were removed and the sheaths were aspirated and flushed.  The sheaths were removed and hemostasis was assured.  A limited bedside echo revealed no pericardial effusion.  CONCLUSIONS: 1. Sinus rhythm with frequent premature ventricular contractions upon     presentation. 2. Successful ablation of 2 PVC foci, one was along the posterior free     wall of the right ventricular outflow tract and the second focus     was along the anteroseptal right ventricular outflow tract. 3. Very rare multifocal PVCs following ablation both on and off     isoproterenol. 4. No early apparent complications.     Hillis Range, MD     JA/MEDQ  D:  08/11/2012  T:  08/12/2012   Job:  [id number]  cc:   Duke Salvia, MD, [facility] Armanda Magic, M.D.

## 2012-08-12 NOTE — Discharge Summary (Signed)
ELECTROPHYSIOLOGY DISCHARGE SUMMARY    Patient ID: Patrick Barnes,  MRN: [id number], DOB/AGE: 1950/12/03 61 y.o.  Admit date: 08/11/2012 Discharge date: 08/12/2012  Primary Care Physician: Zollie Beckers, MD Primary Cardiologist: Armanda Magic, MD  Primary Discharge Diagnosis:  1. RVOT PVCs s/p RF ablation  Secondary Discharge Diagnoses:  1. Dilated CM 2. CAD 3. Anxiety 4. Dyslipidemia 5. Vitamin D deficiency  Procedures This Admission:  RVOT PVC ablation 08/11/2012 1. Comprehensive EP study.  2. Coronary sinus pacing and recording.  3. Three-dimensional mapping of ventricular tachycardia.  4. Radiofrequency ablation of ventricular tachycardia.  5. Isoproterenol infusion.  6. Arrhythmia induction with pacing. CONCLUSIONS:  1. Sinus rhythm with frequent premature ventricular contractions upon  presentation.  2. Successful ablation of 2 PVC foci, one was along the posterior free  wall of the right ventricular outflow tract and the second focus  was along the anteroseptal right ventricular outflow tract.  3. Very rare multifocal PVCs following ablation both on and off  isoproterenol.  4. No early apparent complications.  History and Hospital Course:  [name] is a pleasant 61 year old gentleman with PVCs, dilated CM and CAD who was referred to Dr. Johney Frame for recommendations regarding a high PVC burden (40% PVCs by Holter). Treatment options were reviewed and [name] elected EP study +RF ablation. This was performed yesterday 08/11/2012. He tolerated this procedure well without any immediate complication. He remains hemodynamically stable and afebrile. His groin site is intact without significant bleeding or hematoma. He is ambulating without difficulty. Telemetry shows normal sinus rhythm with occasional PVCs. He has been given discharge instructions including wound care and activity restrictions. He will follow-up in clinic in 4-6 weeks. He has been seen, examined and  deemed stable for discharge today by Dr. Hillis Range.  Physical Exam: Vitals: Blood pressure 104/75, pulse 46, temperature 98.2 F (36.8 C), temperature source Oral, resp. rate 17, height 5\' 6"  (1.676 m), weight 121 lb (54.885 kg), SpO2 99% General: Well developed, well appearing 61 year old male in no acute distress. Heart: RRR. S1, S2 without murmur, rub or gallop. Lungs: CTA bilaterally. No wheezes, rales or rhonchi. Extremities: No cyanosis, clubbing or edema. Pedal pulses present and equal bilaterally. Right groin site intact without bleeding or hematoma.   Labs: None recent, in last 48-72 hours  Disposition:  The patient is being discharged in stable condition.  Follow-up: Follow-up Information    Follow up with Hillis Range, MD. On 09/12/2012. (At 2:45 PM)    Contact information:   [address]  [address] [place] [address] [phone]   Discharge Medications:  Medication List  TAKE these medications         acetaminophen 500 MG tablet   Commonly known as: TYLENOL   Take 1,000 mg by mouth daily as needed. For sinus headaches      aspirin 325 MG tablet   Take 325 mg by mouth daily.      clopidogrel 75 MG tablet   Commonly known as: PLAVIX   Take 75 mg by mouth daily with breakfast.      nitroGLYCERIN 0.4 MG SL tablet   Commonly known as: NITROSTAT   Place 0.4 mg under the tongue every 5 (five) minutes as needed. For chest pain      ramipril 5 MG capsule   Commonly known as: ALTACE   Take 5 mg by mouth daily.      sertraline 50 MG tablet   Commonly known as: ZOLOFT  Take 50 mg by mouth daily.      simvastatin 20 MG tablet   Commonly known as: ZOCOR   Take 20 mg by mouth daily at 6 PM.      Testosterone 20.25 MG/ACT (1.62%) Gel   Apply 1 application topically every morning. To shoulders      Duration of Discharge Encounter: Greater than 30 minutes including physician time.  Limmie Patricia, PA-C 08/12/2012, 9:15 AM   Hillis Range, MD

## 2012-08-16 ENCOUNTER — Telehealth: Payer: Self-pay | Admitting: Internal Medicine

## 2012-08-16 NOTE — Telephone Encounter (Signed)
I have talked with patient's wife and let her know for them to come tomorrow for an EKG at 8:30am

## 2012-08-16 NOTE — Telephone Encounter (Signed)
New problem:  Check heart rhythm on last night . Reading 20 PVC per min @ 8:30pm again 12 PVC per min.  Both reading was check back to back. No chest pain, no sob. Advise from Dr. Mendel Corning to call the office with information.

## 2012-08-17 ENCOUNTER — Encounter: Payer: Self-pay | Admitting: Internal Medicine

## 2012-08-17 NOTE — Telephone Encounter (Signed)
Patient came in for an EKG.  It looked good only one PVC and none on rhythm strip

## 2012-08-17 NOTE — Telephone Encounter (Signed)
Keep regular follow-up appointment

## 2012-08-19 ENCOUNTER — Telehealth: Payer: Self-pay | Admitting: Internal Medicine

## 2012-08-19 NOTE — Telephone Encounter (Signed)
**Note De-Identified Bethany Cumming Obfuscation** Pt's wife advised that it is ok to have flu and pneumonia injections next week, she verbalized understanding./LV

## 2012-08-19 NOTE — Telephone Encounter (Signed)
New problem:  S/p ablation . When can he take the flu  &  pneumonia shot.

## 2012-09-12 ENCOUNTER — Encounter: Payer: Self-pay | Admitting: Internal Medicine

## 2012-09-12 ENCOUNTER — Ambulatory Visit (INDEPENDENT_AMBULATORY_CARE_PROVIDER_SITE_OTHER): Payer: Federal, State, Local not specified - PPO | Admitting: Internal Medicine

## 2012-09-12 VITALS — BP 127/86 | HR 55 | Ht 66.0 in | Wt 123.0 lb

## 2012-09-12 DIAGNOSIS — I428 Other cardiomyopathies: Secondary | ICD-10-CM

## 2012-09-12 DIAGNOSIS — I493 Ventricular premature depolarization: Secondary | ICD-10-CM

## 2012-09-12 DIAGNOSIS — I420 Dilated cardiomyopathy: Secondary | ICD-10-CM

## 2012-09-12 DIAGNOSIS — I4949 Other premature depolarization: Secondary | ICD-10-CM

## 2012-09-12 NOTE — Patient Instructions (Signed)
Your physician recommends that you schedule a follow-up appointment as needed with Dr Johney Frame  Please make a follow up appointment for Dr Mayford Knife  Your physician has recommended that you wear a holter monitor. Holter monitors are medical devices that record the heart's electrical activity. Doctors most often use these monitors to diagnose arrhythmias. Arrhythmias are problems with the speed or rhythm of the heartbeat. The monitor is a small, portable device. You can wear one while you do your normal daily activities. This is usually used to diagnose what is causing palpitations/syncope (passing out).

## 2012-09-12 NOTE — Assessment & Plan Note (Signed)
I recommend that Dr Mayford Knife repeat Echo in 3 months to reassess EF post ablation  He will follow-up with Dr Mayford Knife and I will see as needed going forward.

## 2012-09-12 NOTE — Progress Notes (Signed)
PCP: Gaye Alken, MD Primary Cardiologist:  Dr Leota Sauers Patrick Barnes is a 61 y.o. male who presents today for routine electrophysiology followup.  Since his recent PVC ablation, the patient reports doing very well.  He denies procedure related complications.   Today, he denies symptoms of palpitations, chest pain, shortness of breath,  lower extremity edema, dizziness, presyncope, or syncope.  The patient is otherwise without complaint today.   Past Medical History  Diagnosis Date  . Premature ventricular contraction     Left bundle branch block inferior axis morphology  . Low testosterone   . Vitamin D deficiency   . Anxiety   . Dilated cardiomyopathy     Ejection fraction 30-35% with repeat echo 03/13/1939-45%  . Hx of tonsillectomy   . H/O vasectomy   . Inguinal hernia   . CAD (coronary artery disease) 2/13    cardiac cath...with 80% stenosis in proximal LAD with PCI    EF 40-40%  . Dyslipidemia    Past Surgical History  Procedure Date  . Tonsillectomy   . Cystectomy   . Vasectomy   . Hernia repair     Inguinal  . Ep study and ablation for rvot pvcs 08/12/12    Current Outpatient Prescriptions  Medication Sig Dispense Refill  . acetaminophen (TYLENOL) 500 MG tablet Take 1,000 mg by mouth daily as needed. For sinus headaches      . aspirin 325 MG tablet Take 325 mg by mouth daily.      . clopidogrel (PLAVIX) 75 MG tablet Take 75 mg by mouth daily with breakfast.      . nitroGLYCERIN (NITROSTAT) 0.4 MG SL tablet Place 0.4 mg under the tongue every 5 (five) minutes as needed. For chest pain      . ramipril (ALTACE) 5 MG capsule Take 5 mg by mouth daily.      . sertraline (ZOLOFT) 50 MG tablet Take 50 mg by mouth daily.      . simvastatin (ZOCOR) 20 MG tablet Take 20 mg by mouth daily at 6 PM.      . Testosterone 20.25 MG/ACT (1.62%) GEL Apply 1 application topically every morning. On side of thigh        Physical Exam: Filed Vitals:   09/12/12 1502  BP: 127/86   Pulse: 55  Height: 5\' 6"  (1.676 m)  Weight: 123 lb (55.792 kg)    GEN- The patient is well appearing, alert and oriented x 3 today.   Head- normocephalic, atraumatic Eyes-  Sclera clear, conjunctiva pink Ears- hearing intact Oropharynx- clear Lungs- Clear to ausculation bilaterally, normal work of breathing Heart- Regular rate and rhythm, no murmurs, rubs or gallops, PMI not laterally displaced GI- soft, NT, ND, + BS Extremities- no clubbing, cyanosis, or edema  ekg today reveals sinus rhythm 54 bpm, PR 172, LVH, otherwise normal ekg No pvcs  Assessment and Plan:

## 2012-09-12 NOTE — Assessment & Plan Note (Signed)
Doing well s/p PVC ablation Will obtain 48 hour holter to evaluate for PVC burden post ablation

## 2012-10-11 ENCOUNTER — Encounter: Payer: Self-pay | Admitting: Internal Medicine

## 2012-10-11 ENCOUNTER — Encounter (INDEPENDENT_AMBULATORY_CARE_PROVIDER_SITE_OTHER): Payer: Federal, State, Local not specified - PPO

## 2012-10-11 DIAGNOSIS — R002 Palpitations: Secondary | ICD-10-CM

## 2012-10-11 DIAGNOSIS — I447 Left bundle-branch block, unspecified: Secondary | ICD-10-CM

## 2012-10-19 ENCOUNTER — Encounter: Payer: Self-pay | Admitting: Internal Medicine

## 2012-10-21 ENCOUNTER — Telehealth: Payer: Self-pay | Admitting: *Deleted

## 2012-10-21 NOTE — Telephone Encounter (Signed)
lmom for patient with his monitor results  Rare PVC's post ablation  Adequate suppression of PVC's.

## 2013-01-14 ENCOUNTER — Other Ambulatory Visit: Payer: Self-pay

## 2013-10-05 ENCOUNTER — Other Ambulatory Visit: Payer: Self-pay

## 2013-11-20 ENCOUNTER — Other Ambulatory Visit: Payer: Self-pay | Admitting: Cardiology

## 2013-11-29 ENCOUNTER — Encounter: Payer: Self-pay | Admitting: General Surgery

## 2013-12-20 ENCOUNTER — Other Ambulatory Visit: Payer: Self-pay

## 2013-12-20 ENCOUNTER — Ambulatory Visit (INDEPENDENT_AMBULATORY_CARE_PROVIDER_SITE_OTHER): Payer: Federal, State, Local not specified - PPO | Admitting: Cardiology

## 2013-12-20 ENCOUNTER — Encounter: Payer: Self-pay | Admitting: Cardiology

## 2013-12-20 VITALS — BP 112/80 | HR 52 | Ht 66.0 in | Wt 130.4 lb

## 2013-12-20 DIAGNOSIS — I2510 Atherosclerotic heart disease of native coronary artery without angina pectoris: Secondary | ICD-10-CM

## 2013-12-20 DIAGNOSIS — I493 Ventricular premature depolarization: Secondary | ICD-10-CM

## 2013-12-20 DIAGNOSIS — I4949 Other premature depolarization: Secondary | ICD-10-CM

## 2013-12-20 DIAGNOSIS — I420 Dilated cardiomyopathy: Secondary | ICD-10-CM

## 2013-12-20 DIAGNOSIS — E785 Hyperlipidemia, unspecified: Secondary | ICD-10-CM

## 2013-12-20 DIAGNOSIS — I428 Other cardiomyopathies: Secondary | ICD-10-CM

## 2013-12-20 MED ORDER — CLOPIDOGREL BISULFATE 75 MG PO TABS: 75.0000 mg | ORAL_TABLET | Freq: Every day | ORAL | Status: DC

## 2013-12-20 NOTE — Progress Notes (Signed)
[facility] [place], [place], [place]  [address] Phone: [phone] Fax:  [phone]  Date:  12/20/2013   ID:  Patrick Barnes, DOB November 20, 1951, MRN [id number]  PCP:  Gerrit Heck, MD  Cardiologist:  Fransico Him, MD     History of Present Illness: Patrick Barnes is a 63 y.o. male with a history of ASCAD, PVC's, ischemic DCM and dyslipidemia who presents today for followup.  He denies any chest pain, SOB, DOE, LE edema, palpitations, dizziness or syncope.     Wt Readings from Last 3 Encounters:  12/20/13 130 lb 6.4 oz (59.149 kg)  11/29/13 126 lb (57.153 kg)  09/12/12 123 lb (55.792 kg)     Past Medical History  Diagnosis Date  . Premature ventricular contraction     Left bundle branch block inferior axis morphology  . Low testosterone   . Vitamin D deficiency   . Anxiety   . Dilated cardiomyopathy     Ejection fraction 45-50%  echo 03/12/2013  . Hx of tonsillectomy   . H/O vasectomy   . Inguinal hernia   . Dyslipidemia   . Hypogonadotropic hypogonadism     Dr. Buddy Duty and Dr Jeffie Pollock  . Elevated LFTs     history of-Now resolved  . Elevated CPK     Proboly due to excessive exercise.  . Low bone density for age     endo is following  . Inguinal hernia   . PVC's (premature ventricular contractions)     s/p ablation Dr Rayann Heman and Dr Radford Pax  . CAD (coronary artery disease) 2/13    cardiac cath...with 80% stenosis in proximal LAD with PCI    EF 40-40%  . Hyperlipidemia     Current Outpatient Prescriptions  Medication Sig Dispense Refill  . acetaminophen (TYLENOL) 500 MG tablet Take 1,000 mg by mouth daily as needed. For sinus headaches      . aspirin 325 MG tablet Take 325 mg by mouth daily.      . clopidogrel (PLAVIX) 75 MG tablet Take 75 mg by mouth daily with breakfast.      . DENTA 5000 PLUS 1.1 % CREA dental cream Place 1 application onto teeth at bedtime.       . nitroGLYCERIN (NITROSTAT) 0.4 MG SL tablet Place 0.4 mg under the tongue every 5 (five)  minutes as needed. For chest pain      . ramipril (ALTACE) 5 MG capsule TAKE ONE CAPSULE BY MOUTH ONCE A DAY  90 capsule  0  . sertraline (ZOLOFT) 50 MG tablet Take 50 mg by mouth daily.      . simvastatin (ZOCOR) 40 MG tablet Take 40 mg by mouth daily.      . Testosterone (FORTESTA) 10 MG/ACT (2%) GEL Place 5 Squirts onto the skin daily.      . Vitamin D, Ergocalciferol, (DRISDOL) 50000 UNITS CAPS capsule Take 50,000 Units by mouth every 7 (seven) days.        No current facility-administered medications for this visit.    Allergies:    Allergies  Allergen Reactions  . Quinolones     Prolonged QT    Social History:  The patient  reports that he has never smoked. He has never used smokeless tobacco. He reports that he drinks alcohol. He reports that he does not use illicit drugs.   Family History:  The patient's family history includes Cancer in his mother.   ROS:  Please see the history of present illness.  All other systems reviewed and negative.   PHYSICAL EXAM: VS:  BP 112/80  Pulse 52  Ht 5\' 6"  (1.676 m)  Wt 130 lb 6.4 oz (59.149 kg)  BMI 21.06 kg/m2 Well nourished, well developed, in no acute distress HEENT: normal Neck: no JVD Cardiac:  normal S1, S2; RRR; no murmur Lungs:  clear to auscultation bilaterally, no wheezing, rhonchi or rales Abd: soft, nontender, no hepatomegaly Ext: no edema Skin: warm and dry Neuro:  CNs 2-12 intact, no focal abnormalities noted       ASSESSMENT AND PLAN:  1. ASCAD with no angina  - continue ASA/Plavix  - I have told him he can stop his Plavix after 01/12/2013 since he has had some problems with nosebleeds this year on plavix 2. Dyslipidemia - LDL at goal at 65 with lipids on 10/2103   - continue Zocor 3. Ischemic DCM EF 45-50%  - continue ACE I - no beta blocker secondary to bradycardia 4. PVC's s/p ablation  Followup with me in 6 months  Signed, Fransico Him, MD 12/20/2013 10:09 AM

## 2013-12-20 NOTE — Patient Instructions (Signed)
Your physician has recommended you make the following change in your medication: It is ok to stop Plavix on 01/12/14  Your physician wants you to follow-up in: 6 Months with Dr Mallie Patrick Barnes will receive a reminder letter in the mail two months in advance. If you don't receive a letter, please call our office to schedule the follow-up appointment.

## 2014-05-22 ENCOUNTER — Other Ambulatory Visit: Payer: Self-pay | Admitting: *Deleted

## 2014-05-22 MED ORDER — RAMIPRIL 5 MG PO CAPS: ORAL_CAPSULE | ORAL | Status: DC

## 2014-09-10 ENCOUNTER — Other Ambulatory Visit: Payer: Self-pay | Admitting: Cardiology

## 2014-10-04 ENCOUNTER — Other Ambulatory Visit: Payer: Self-pay

## 2014-10-04 MED ORDER — RAMIPRIL 5 MG PO CAPS: ORAL_CAPSULE | ORAL | Status: DC

## 2014-10-10 ENCOUNTER — Ambulatory Visit (INDEPENDENT_AMBULATORY_CARE_PROVIDER_SITE_OTHER): Payer: Federal, State, Local not specified - PPO | Admitting: Cardiology

## 2014-10-10 ENCOUNTER — Encounter: Payer: Self-pay | Admitting: Cardiology

## 2014-10-10 VITALS — BP 128/90 | HR 48 | Ht 66.0 in | Wt 138.8 lb

## 2014-10-10 DIAGNOSIS — I2510 Atherosclerotic heart disease of native coronary artery without angina pectoris: Secondary | ICD-10-CM

## 2014-10-10 DIAGNOSIS — I2583 Coronary atherosclerosis due to lipid rich plaque: Principal | ICD-10-CM

## 2014-10-10 DIAGNOSIS — I420 Dilated cardiomyopathy: Secondary | ICD-10-CM

## 2014-10-10 DIAGNOSIS — I493 Ventricular premature depolarization: Secondary | ICD-10-CM

## 2014-10-10 DIAGNOSIS — E785 Hyperlipidemia, unspecified: Secondary | ICD-10-CM

## 2014-10-10 MED ORDER — ASPIRIN EC 81 MG PO TBEC: 81.0000 mg | DELAYED_RELEASE_TABLET | Freq: Every day | ORAL | Status: DC

## 2014-10-10 MED ORDER — NITROGLYCERIN 0.4 MG SL SUBL: 0.4000 mg | SUBLINGUAL_TABLET | SUBLINGUAL | Status: DC | PRN

## 2014-10-10 MED ORDER — RAMIPRIL 5 MG PO CAPS: ORAL_CAPSULE | ORAL | Status: DC

## 2014-10-10 NOTE — Patient Instructions (Signed)
Your physician has recommended you make the following change in your medication:  1) DECREASE Aspirin to 81 mg daily.  Your physician recommends that you return for lab work next Tuesday, November 17. (FASTING LABS)  Your physician wants you to follow-up in: 6 months with Dr. Radford Pax. You will receive a reminder letter in the mail two months in advance. If you don't receive a letter, please call our office to schedule the follow-up appointment.

## 2014-10-10 NOTE — Progress Notes (Signed)
[facility] [place], [place], [place]  [address] Phone: [phone] Fax:  [phone]  Date:  10/10/2014   ID:  Windel Keziah, DOB 1951/04/06, MRN [id number]  PCP:  Gerrit Heck, MD  Cardiologist:  Fransico Him, MD    History of Present Illness: Morton Simson is a 63 y.o. male with a history of ASCAD, PVC's, ischemic DCM and dyslipidemia who presents today for followup. He denies any chest pain, SOB, DOE, LE edema, palpitations, dizziness or syncope.    Wt Readings from Last 3 Encounters:  10/10/14 138 lb 12.8 oz (62.959 kg)  12/20/13 130 lb 6.4 oz (59.149 kg)  11/29/13 126 lb (57.153 kg)     Past Medical History  Diagnosis Date  . Premature ventricular contraction     Left bundle branch block inferior axis morphology  . Low testosterone   . Vitamin D deficiency   . Anxiety   . Dilated cardiomyopathy     Ejection fraction 30-35% with repeat echo 03/13/1939-45%  . Hx of tonsillectomy   . H/O vasectomy   . Inguinal hernia   . Dyslipidemia   . Hypogonadotropic hypogonadism     Dr. Buddy Duty and Dr Jeffie Pollock  . Elevated LFTs     history of-Now resolved  . Elevated CPK     Proboly due to excessive exercise.  . Low bone density for age     endo is following  . Inguinal hernia   . PVC's (premature ventricular contractions)     s/p ablation Dr Rayann Heman and Dr Radford Pax  . CAD (coronary artery disease) 2/13    cardiac cath...with 80% stenosis in proximal LAD with PCI    EF 40-40%  . Hyperlipidemia     Current Outpatient Prescriptions  Medication Sig Dispense Refill  . acetaminophen (TYLENOL) 500 MG tablet Take 1,000 mg by mouth daily as needed. For sinus headaches    . aspirin 325 MG tablet Take 325 mg by mouth daily.    . DENTA 5000 PLUS 1.1 % CREA dental cream Place 1 application onto teeth at bedtime.     . nitroGLYCERIN (NITROSTAT) 0.4 MG SL tablet Place 0.4 mg under the tongue every 5 (five) minutes as needed. For chest pain    . ramipril (ALTACE) 5 MG capsule  TAKE ONE CAPSULE BY MOUTH ONCE A DAY 90 capsule 0  . sertraline (ZOLOFT) 50 MG tablet Take 50 mg by mouth daily.    . simvastatin (ZOCOR) 40 MG tablet Take 40 mg by mouth daily.    . Testosterone (FORTESTA) 10 MG/ACT (2%) GEL Place 4 Squirts onto the skin daily.     . Vitamin D, Ergocalciferol, (DRISDOL) 50000 UNITS CAPS capsule Take 50,000 Units by mouth every 7 (seven) days.      No current facility-administered medications for this visit.    Allergies:    Allergies  Allergen Reactions  . Quinolones     Prolonged QT    Social History:  The patient  reports that he has never smoked. He has never used smokeless tobacco. He reports that he drinks alcohol. He reports that he does not use illicit drugs.   Family History:  The patient's family history includes Cancer in his mother.   ROS:  Please see the history of present illness.      All other systems reviewed and negative.   PHYSICAL EXAM: VS:  BP 128/90 mmHg  Pulse 48  Ht 5\' 6"  (1.676 m)  Wt 138 lb 12.8 oz (62.959 kg)  BMI 22.41 kg/m2 Well nourished, well developed, in no acute distress HEENT: normal Neck: no JVD Cardiac:  normal S1, S2; RRR; no murmur Lungs:  clear to auscultation bilaterally, no wheezing, rhonchi or rales Abd: soft, nontender, no hepatomegaly Ext: no edema Skin: warm and dry Neuro:  CNs 2-12 intact, no focal abnormalities noted  EKG: NSR with LVH by voltage criteria      ASSESSMENT AND PLAN:  1. ASCAD with no angina - decrease ASA to 81mg  daily 2.   Dyslipidemia  - continue Zocor       - check FLP and ALT 3. Ischemic DCM EF 45-50% - continue ACE I - no beta blocker secondary to bradycardia   4.  PVC's s/p ablation   5.  Asymptomatic bradycardia  Followup with me in 6 months   Signed, Fransico Him, MD [facility] 10/10/2014 3:04 PM

## 2014-10-16 ENCOUNTER — Other Ambulatory Visit (INDEPENDENT_AMBULATORY_CARE_PROVIDER_SITE_OTHER): Payer: Federal, State, Local not specified - PPO | Admitting: *Deleted

## 2014-10-16 DIAGNOSIS — I2583 Coronary atherosclerosis due to lipid rich plaque: Secondary | ICD-10-CM

## 2014-10-16 DIAGNOSIS — I420 Dilated cardiomyopathy: Secondary | ICD-10-CM

## 2014-10-16 DIAGNOSIS — E785 Hyperlipidemia, unspecified: Secondary | ICD-10-CM

## 2014-10-16 DIAGNOSIS — I2510 Atherosclerotic heart disease of native coronary artery without angina pectoris: Secondary | ICD-10-CM

## 2014-10-17 LAB — LIPID PANEL
CHOLESTEROL: 137 mg/dL (ref 0–200)
HDL: 60.9 mg/dL (ref >39.00)
LDL Cholesterol: 67 mg/dL (ref 0–99)
NonHDL: 76.1
Total CHOL/HDL Ratio: 2
Triglycerides: 46 mg/dL (ref 0.0–149.0)
VLDL: 9.2 mg/dL (ref 0.0–40.0)

## 2014-10-17 LAB — BASIC METABOLIC PANEL
BUN: 18 mg/dL (ref 6–23)
CHLORIDE: 107 meq/L (ref 96–112)
CO2: 25 mEq/L (ref 19–32)
Calcium: 9 mg/dL (ref 8.4–10.5)
Creatinine, Ser: 1.1 mg/dL (ref 0.4–1.5)
GFR: 73.01 mL/min (ref >60.00)
GLUCOSE: 78 mg/dL (ref 70–99)
POTASSIUM: 5.1 meq/L (ref 3.5–5.1)
SODIUM: 139 meq/L (ref 135–145)

## 2014-10-17 LAB — ALT: ALT: 19 U/L (ref 0–53)

## 2014-11-08 ENCOUNTER — Encounter (HOSPITAL_COMMUNITY): Payer: Self-pay | Admitting: Interventional Cardiology

## 2015-01-17 DIAGNOSIS — F632 Kleptomania: Secondary | ICD-10-CM | POA: Diagnosis not present

## 2015-01-17 DIAGNOSIS — F411 Generalized anxiety disorder: Secondary | ICD-10-CM | POA: Diagnosis not present

## 2015-01-24 DIAGNOSIS — F632 Kleptomania: Secondary | ICD-10-CM | POA: Diagnosis not present

## 2015-01-24 DIAGNOSIS — F411 Generalized anxiety disorder: Secondary | ICD-10-CM | POA: Diagnosis not present

## 2015-01-29 DIAGNOSIS — F331 Major depressive disorder, recurrent, moderate: Secondary | ICD-10-CM | POA: Diagnosis not present

## 2015-02-14 DIAGNOSIS — F411 Generalized anxiety disorder: Secondary | ICD-10-CM | POA: Diagnosis not present

## 2015-02-14 DIAGNOSIS — F632 Kleptomania: Secondary | ICD-10-CM | POA: Diagnosis not present

## 2015-02-26 DIAGNOSIS — F331 Major depressive disorder, recurrent, moderate: Secondary | ICD-10-CM | POA: Diagnosis not present

## 2015-02-28 DIAGNOSIS — F411 Generalized anxiety disorder: Secondary | ICD-10-CM | POA: Diagnosis not present

## 2015-02-28 DIAGNOSIS — F632 Kleptomania: Secondary | ICD-10-CM | POA: Diagnosis not present

## 2015-03-29 DIAGNOSIS — F331 Major depressive disorder, recurrent, moderate: Secondary | ICD-10-CM | POA: Diagnosis not present

## 2015-04-11 DIAGNOSIS — F411 Generalized anxiety disorder: Secondary | ICD-10-CM | POA: Diagnosis not present

## 2015-04-11 DIAGNOSIS — F632 Kleptomania: Secondary | ICD-10-CM | POA: Diagnosis not present

## 2015-04-18 DIAGNOSIS — F632 Kleptomania: Secondary | ICD-10-CM | POA: Diagnosis not present

## 2015-04-18 DIAGNOSIS — F411 Generalized anxiety disorder: Secondary | ICD-10-CM | POA: Diagnosis not present

## 2015-04-18 DIAGNOSIS — Z5181 Encounter for therapeutic drug level monitoring: Secondary | ICD-10-CM | POA: Diagnosis not present

## 2015-04-18 DIAGNOSIS — E230 Hypopituitarism: Secondary | ICD-10-CM | POA: Diagnosis not present

## 2015-04-22 DIAGNOSIS — E230 Hypopituitarism: Secondary | ICD-10-CM | POA: Diagnosis not present

## 2015-04-22 DIAGNOSIS — Z5181 Encounter for therapeutic drug level monitoring: Secondary | ICD-10-CM | POA: Diagnosis not present

## 2015-04-22 DIAGNOSIS — M8580 Other specified disorders of bone density and structure, unspecified site: Secondary | ICD-10-CM | POA: Diagnosis not present

## 2015-04-22 DIAGNOSIS — E875 Hyperkalemia: Secondary | ICD-10-CM | POA: Diagnosis not present

## 2015-04-25 DIAGNOSIS — F331 Major depressive disorder, recurrent, moderate: Secondary | ICD-10-CM | POA: Diagnosis not present

## 2015-04-25 DIAGNOSIS — F632 Kleptomania: Secondary | ICD-10-CM | POA: Diagnosis not present

## 2015-04-25 DIAGNOSIS — F411 Generalized anxiety disorder: Secondary | ICD-10-CM | POA: Diagnosis not present

## 2015-04-29 ENCOUNTER — Other Ambulatory Visit: Payer: Self-pay | Admitting: Cardiology

## 2015-05-02 DIAGNOSIS — Z23 Encounter for immunization: Secondary | ICD-10-CM | POA: Diagnosis not present

## 2015-05-02 DIAGNOSIS — E875 Hyperkalemia: Secondary | ICD-10-CM | POA: Diagnosis not present

## 2015-05-02 DIAGNOSIS — F411 Generalized anxiety disorder: Secondary | ICD-10-CM | POA: Diagnosis not present

## 2015-05-02 DIAGNOSIS — Z0000 Encounter for general adult medical examination without abnormal findings: Secondary | ICD-10-CM | POA: Diagnosis not present

## 2015-05-02 DIAGNOSIS — Z1389 Encounter for screening for other disorder: Secondary | ICD-10-CM | POA: Diagnosis not present

## 2015-05-02 DIAGNOSIS — E559 Vitamin D deficiency, unspecified: Secondary | ICD-10-CM | POA: Diagnosis not present

## 2015-05-02 DIAGNOSIS — E780 Pure hypercholesterolemia: Secondary | ICD-10-CM | POA: Diagnosis not present

## 2015-05-02 DIAGNOSIS — R7309 Other abnormal glucose: Secondary | ICD-10-CM | POA: Diagnosis not present

## 2015-05-09 DIAGNOSIS — F411 Generalized anxiety disorder: Secondary | ICD-10-CM | POA: Diagnosis not present

## 2015-05-09 DIAGNOSIS — F632 Kleptomania: Secondary | ICD-10-CM | POA: Diagnosis not present

## 2015-05-16 DIAGNOSIS — F632 Kleptomania: Secondary | ICD-10-CM | POA: Diagnosis not present

## 2015-05-16 DIAGNOSIS — F411 Generalized anxiety disorder: Secondary | ICD-10-CM | POA: Diagnosis not present

## 2015-05-30 DIAGNOSIS — F411 Generalized anxiety disorder: Secondary | ICD-10-CM | POA: Diagnosis not present

## 2015-05-30 DIAGNOSIS — F632 Kleptomania: Secondary | ICD-10-CM | POA: Diagnosis not present

## 2015-06-20 DIAGNOSIS — I959 Hypotension, unspecified: Secondary | ICD-10-CM | POA: Diagnosis not present

## 2015-06-20 DIAGNOSIS — K625 Hemorrhage of anus and rectum: Secondary | ICD-10-CM | POA: Diagnosis not present

## 2015-06-21 DIAGNOSIS — D649 Anemia, unspecified: Secondary | ICD-10-CM | POA: Diagnosis not present

## 2015-06-26 DIAGNOSIS — D649 Anemia, unspecified: Secondary | ICD-10-CM | POA: Diagnosis not present

## 2015-06-27 DIAGNOSIS — F411 Generalized anxiety disorder: Secondary | ICD-10-CM | POA: Diagnosis not present

## 2015-06-27 DIAGNOSIS — F632 Kleptomania: Secondary | ICD-10-CM | POA: Diagnosis not present

## 2015-07-04 DIAGNOSIS — F411 Generalized anxiety disorder: Secondary | ICD-10-CM | POA: Diagnosis not present

## 2015-07-04 DIAGNOSIS — F632 Kleptomania: Secondary | ICD-10-CM | POA: Diagnosis not present

## 2015-07-10 DIAGNOSIS — F331 Major depressive disorder, recurrent, moderate: Secondary | ICD-10-CM | POA: Diagnosis not present

## 2015-07-11 DIAGNOSIS — F411 Generalized anxiety disorder: Secondary | ICD-10-CM | POA: Diagnosis not present

## 2015-07-11 DIAGNOSIS — F632 Kleptomania: Secondary | ICD-10-CM | POA: Diagnosis not present

## 2015-07-24 ENCOUNTER — Other Ambulatory Visit: Payer: Self-pay | Admitting: Cardiology

## 2015-07-24 ENCOUNTER — Other Ambulatory Visit: Payer: Self-pay

## 2015-07-24 MED ORDER — RAMIPRIL 5 MG PO CAPS: ORAL_CAPSULE | ORAL | Status: DC

## 2015-07-25 DIAGNOSIS — L821 Other seborrheic keratosis: Secondary | ICD-10-CM | POA: Diagnosis not present

## 2015-07-25 DIAGNOSIS — D1801 Hemangioma of skin and subcutaneous tissue: Secondary | ICD-10-CM | POA: Diagnosis not present

## 2015-07-25 DIAGNOSIS — L814 Other melanin hyperpigmentation: Secondary | ICD-10-CM | POA: Diagnosis not present

## 2015-07-25 DIAGNOSIS — D225 Melanocytic nevi of trunk: Secondary | ICD-10-CM | POA: Diagnosis not present

## 2015-08-01 DIAGNOSIS — F411 Generalized anxiety disorder: Secondary | ICD-10-CM | POA: Diagnosis not present

## 2015-08-01 DIAGNOSIS — F632 Kleptomania: Secondary | ICD-10-CM | POA: Diagnosis not present

## 2015-08-08 DIAGNOSIS — F632 Kleptomania: Secondary | ICD-10-CM | POA: Diagnosis not present

## 2015-08-08 DIAGNOSIS — F411 Generalized anxiety disorder: Secondary | ICD-10-CM | POA: Diagnosis not present

## 2015-08-22 DIAGNOSIS — F411 Generalized anxiety disorder: Secondary | ICD-10-CM | POA: Diagnosis not present

## 2015-08-22 DIAGNOSIS — F632 Kleptomania: Secondary | ICD-10-CM | POA: Diagnosis not present

## 2015-08-29 DIAGNOSIS — F632 Kleptomania: Secondary | ICD-10-CM | POA: Diagnosis not present

## 2015-08-29 DIAGNOSIS — F411 Generalized anxiety disorder: Secondary | ICD-10-CM | POA: Diagnosis not present

## 2015-09-11 DIAGNOSIS — Z1211 Encounter for screening for malignant neoplasm of colon: Secondary | ICD-10-CM | POA: Diagnosis not present

## 2015-09-11 DIAGNOSIS — K640 First degree hemorrhoids: Secondary | ICD-10-CM | POA: Diagnosis not present

## 2015-09-12 DIAGNOSIS — F411 Generalized anxiety disorder: Secondary | ICD-10-CM | POA: Diagnosis not present

## 2015-09-12 DIAGNOSIS — F632 Kleptomania: Secondary | ICD-10-CM | POA: Diagnosis not present

## 2015-09-19 DIAGNOSIS — F411 Generalized anxiety disorder: Secondary | ICD-10-CM | POA: Diagnosis not present

## 2015-09-19 DIAGNOSIS — F632 Kleptomania: Secondary | ICD-10-CM | POA: Diagnosis not present

## 2015-10-03 DIAGNOSIS — F331 Major depressive disorder, recurrent, moderate: Secondary | ICD-10-CM | POA: Diagnosis not present

## 2015-10-03 DIAGNOSIS — F632 Kleptomania: Secondary | ICD-10-CM | POA: Diagnosis not present

## 2015-10-03 DIAGNOSIS — F411 Generalized anxiety disorder: Secondary | ICD-10-CM | POA: Diagnosis not present

## 2015-10-31 DIAGNOSIS — F411 Generalized anxiety disorder: Secondary | ICD-10-CM | POA: Diagnosis not present

## 2015-10-31 DIAGNOSIS — F632 Kleptomania: Secondary | ICD-10-CM | POA: Diagnosis not present

## 2015-11-01 DIAGNOSIS — E230 Hypopituitarism: Secondary | ICD-10-CM | POA: Diagnosis not present

## 2015-11-01 DIAGNOSIS — R7309 Other abnormal glucose: Secondary | ICD-10-CM | POA: Diagnosis not present

## 2015-11-01 DIAGNOSIS — E559 Vitamin D deficiency, unspecified: Secondary | ICD-10-CM | POA: Diagnosis not present

## 2015-11-01 DIAGNOSIS — Z5181 Encounter for therapeutic drug level monitoring: Secondary | ICD-10-CM | POA: Diagnosis not present

## 2015-11-01 DIAGNOSIS — F411 Generalized anxiety disorder: Secondary | ICD-10-CM | POA: Diagnosis not present

## 2015-11-01 DIAGNOSIS — F339 Major depressive disorder, recurrent, unspecified: Secondary | ICD-10-CM | POA: Diagnosis not present

## 2015-11-01 DIAGNOSIS — E7800 Pure hypercholesterolemia, unspecified: Secondary | ICD-10-CM | POA: Diagnosis not present

## 2015-11-07 DIAGNOSIS — F331 Major depressive disorder, recurrent, moderate: Secondary | ICD-10-CM | POA: Diagnosis not present

## 2015-11-21 DIAGNOSIS — F411 Generalized anxiety disorder: Secondary | ICD-10-CM | POA: Diagnosis not present

## 2015-11-21 DIAGNOSIS — F632 Kleptomania: Secondary | ICD-10-CM | POA: Diagnosis not present

## 2015-12-26 DIAGNOSIS — E230 Hypopituitarism: Secondary | ICD-10-CM | POA: Diagnosis not present

## 2015-12-30 DIAGNOSIS — M8580 Other specified disorders of bone density and structure, unspecified site: Secondary | ICD-10-CM | POA: Diagnosis not present

## 2015-12-30 DIAGNOSIS — Z5181 Encounter for therapeutic drug level monitoring: Secondary | ICD-10-CM | POA: Diagnosis not present

## 2015-12-30 DIAGNOSIS — E230 Hypopituitarism: Secondary | ICD-10-CM | POA: Diagnosis not present

## 2016-01-09 DIAGNOSIS — F331 Major depressive disorder, recurrent, moderate: Secondary | ICD-10-CM | POA: Diagnosis not present

## 2016-02-07 DIAGNOSIS — F331 Major depressive disorder, recurrent, moderate: Secondary | ICD-10-CM | POA: Diagnosis not present

## 2016-02-10 DIAGNOSIS — J111 Influenza due to unidentified influenza virus with other respiratory manifestations: Secondary | ICD-10-CM | POA: Diagnosis not present

## 2016-02-10 DIAGNOSIS — R6889 Other general symptoms and signs: Secondary | ICD-10-CM | POA: Diagnosis not present

## 2016-05-01 DIAGNOSIS — R7309 Other abnormal glucose: Secondary | ICD-10-CM | POA: Diagnosis not present

## 2016-05-01 DIAGNOSIS — E559 Vitamin D deficiency, unspecified: Secondary | ICD-10-CM | POA: Diagnosis not present

## 2016-05-01 DIAGNOSIS — E7800 Pure hypercholesterolemia, unspecified: Secondary | ICD-10-CM | POA: Diagnosis not present

## 2016-05-05 ENCOUNTER — Encounter: Payer: Self-pay | Admitting: Cardiology

## 2016-05-05 DIAGNOSIS — Z1389 Encounter for screening for other disorder: Secondary | ICD-10-CM | POA: Diagnosis not present

## 2016-05-05 DIAGNOSIS — E7800 Pure hypercholesterolemia, unspecified: Secondary | ICD-10-CM | POA: Diagnosis not present

## 2016-05-05 DIAGNOSIS — Z1211 Encounter for screening for malignant neoplasm of colon: Secondary | ICD-10-CM | POA: Diagnosis not present

## 2016-05-05 DIAGNOSIS — Z0000 Encounter for general adult medical examination without abnormal findings: Secondary | ICD-10-CM | POA: Diagnosis not present

## 2016-05-05 DIAGNOSIS — R7309 Other abnormal glucose: Secondary | ICD-10-CM | POA: Diagnosis not present

## 2016-05-05 DIAGNOSIS — F411 Generalized anxiety disorder: Secondary | ICD-10-CM | POA: Diagnosis not present

## 2016-05-05 DIAGNOSIS — F339 Major depressive disorder, recurrent, unspecified: Secondary | ICD-10-CM | POA: Diagnosis not present

## 2016-05-05 DIAGNOSIS — E878 Other disorders of electrolyte and fluid balance, not elsewhere classified: Secondary | ICD-10-CM | POA: Diagnosis not present

## 2016-05-05 DIAGNOSIS — E230 Hypopituitarism: Secondary | ICD-10-CM | POA: Diagnosis not present

## 2016-05-05 DIAGNOSIS — E559 Vitamin D deficiency, unspecified: Secondary | ICD-10-CM | POA: Diagnosis not present

## 2016-05-05 DIAGNOSIS — I2510 Atherosclerotic heart disease of native coronary artery without angina pectoris: Secondary | ICD-10-CM | POA: Diagnosis not present

## 2016-07-09 ENCOUNTER — Encounter: Payer: Self-pay | Admitting: Cardiology

## 2016-07-22 ENCOUNTER — Encounter: Payer: Self-pay | Admitting: Cardiology

## 2016-07-22 NOTE — Progress Notes (Signed)
Cardiology Office Note    Date:  07/23/2016   ID:  Patrick Barnes, DOB 23-Dec-1950, MRN [id number]  PCP:  Gerrit Heck, MD  Cardiologist:  Fransico Him, MD   Chief Complaint  Patient presents with  . Coronary Artery Disease  . Hypertension  . Hyperlipidemia    History of Present Illness:  Patrick Barnes is a 65 y.o. male with a history of ASCAD s/p PCI of LAD 2013, RVOT PVCs and VT s/p ablation, ischemic DCM EF 45-50% and dyslipidemia who presents today for followup. He denies any chest pain, SOB, DOE, LE edema, palpitations, dizziness, claudication, fatigue or syncope.     Past Medical History:  Diagnosis Date  . Anxiety   . Bradycardia 07/23/2016  . CAD (coronary artery disease) 01/2012   cardiac cath...with 80% stenosis in proximal LAD with PCI     . Dilated cardiomyopathy ([place])    Echo 11/2012 EF 45-50%  . Dyslipidemia   . Elevated CPK    Proboly due to excessive exercise.  . Elevated LFTs    history of-Now resolved  . H/O vasectomy   . Hx of tonsillectomy   . Hypogonadotropic hypogonadism ([facility])    Dr. Buddy Duty and Dr Jeffie Pollock  . Inguinal hernia   . Low bone density for age    endo is following  . Low testosterone   . PVC's (premature ventricular contractions)    s/p ablation Dr Rayann Heman and Dr Radford Pax  . Vitamin D deficiency     Past Surgical History:  Procedure Laterality Date  . CARDIAC CATHETERIZATION     w 80% stenosis in proimal LAD w PCI on 2/13.  [name] Kitchen COLONOSCOPY  10/2005   had internal nonbleeding small hemorrhoids,repeat in 10 years.  . CYSTECTOMY    . EP study and ablation for RVOT PVCs  08/12/12  . HERNIA REPAIR     Inguinal  . PERCUTANEOUS CORONARY STENT INTERVENTION (PCI-S) Bilateral 01/13/2012   Procedure: PERCUTANEOUS CORONARY STENT INTERVENTION (PCI-S);  Surgeon: Jettie Booze, MD;  Location[facility] C[facility] LAB;  Service: Cardiovascular;  Laterality: Bilateral;  possible radial artery  . TONSILLECTOMY    . V-TACH ABLATION N/A 08/11/2012   Procedure: V-TACH ABLATION;  Surgeon: Thompson Grayer, MD;  Location: [facility] CATH LAB;  Service: Cardiovascular;  Laterality: N/A;  . VASECTOMY      Current Medications: Outpatient Medications Prior to Visit  Medication Sig Dispense Refill  . acetaminophen (TYLENOL) 500 MG tablet Take 1,000 mg by mouth daily as needed. For sinus headaches    . DENTA 5000 PLUS 1.1 % CREA dental cream Place 1 application onto teeth at bedtime.     . nitroGLYCERIN (NITROSTAT) 0.4 MG SL tablet Place 1 tablet (0.4 mg total) under the tongue every 5 (five) minutes as needed. For chest pain 25 tablet 1  . ramipril (ALTACE) 5 MG capsule TAKE ONE CAPSULE BY MOUTH ONCE A DAY 30 capsule 0  . sertraline (ZOLOFT) 50 MG tablet Take 50 mg by mouth daily.    . simvastatin (ZOCOR) 40 MG tablet Take 40 mg by mouth daily.    . Testosterone (FORTESTA) 10 MG/ACT (2%) GEL Place 4 Squirts onto the skin daily.     [name] Kitchen aspirin EC 81 MG tablet Take 1 tablet (81 mg total) by mouth daily. (Patient not taking: Reported on 07/23/2016) 90 tablet 3  . Vitamin D, Ergocalciferol, (DRISDOL) 50000 UNITS CAPS capsule Take 50,000 Units by mouth every 7 (seven) days.      No facility-administered medications prior  to visit.      Allergies:   Quinolones   Social History   Social History  . Marital status: Married    Spouse name: N/A  . Number of children: N/A  . Years of education: N/A   Occupational History  . retired    Social History Main Topics  . Smoking status: Never Smoker  . Smokeless tobacco: Never Used  . Alcohol use Yes     Comment: occasional  . Drug use: No  . Sexual activity: Not Currently   Other Topics Concern  . None   Social History Narrative  . None     Family History:  The patient's family history includes Cancer in his mother.   ROS:   Please see the history of present illness.    ROS All other systems reviewed and are negative.   PHYSICAL EXAM:   VS:  BP 118/66   Pulse (!) 43   Ht 5\' 6"  (1.676 m)   Wt  133 lb (60.3 kg)   BMI 21.47 kg/m    GEN: Well nourished, well developed, in no acute distress  HEENT: normal  Neck: no JVD, carotid bruits, or masses Cardiac: RRR; no murmurs, rubs, or gallops,no edema.  Intact distal pulses bilaterally.  Respiratory:  clear to auscultation bilaterally, normal work of breathing GI: soft, nontender, nondistended, + BS MS: no deformity or atrophy  Skin: warm and dry, no rash Neuro:  Alert and Oriented x 3, Strength and sensation are intact Psych: euthymic mood, full affect  Wt Readings from Last 3 Encounters:  07/23/16 133 lb (60.3 kg)  10/10/14 138 lb 12.8 oz (63 kg)  12/20/13 130 lb 6.4 oz (59.1 kg)      Studies/Labs Reviewed:   EKG:  EKG is ordered today.  The ekg ordered today demonstrates sinus bradycardia at 43bpm with early repolarization  Recent Labs: No results found for requested labs within last 8760 hours.   Lipid Panel    Component Value Date/Time   CHOL 137 10/16/2014 0740   TRIG 46.0 10/16/2014 0740   HDL 60.90 10/16/2014 0740   CHOLHDL 2 10/16/2014 0740   VLDL 9.2 10/16/2014 0740   LDLCALC 67 10/16/2014 0740    Additional studies/ records that were reviewed today include:  none    ASSESSMENT:    1. Coronary artery disease due to lipid rich plaque   2. Dilated cardiomyopathy ([place])   3. PVC (premature ventricular contraction)   4. Hyperlipidemia   5. Bradycardia      PLAN:  In order of problems listed above:  1. ASCAD s/p PCI of LAD 2013 with no angina.  Continue ASA/statin. He exercises on the elliptical for 50-60 minutes at a time without any problems.  2. DCM - EF 45-50% by echo 2016. Continue ACE I. 3. PVC's s/p ablation for RVOT origin PVCs and VT with no reoccurence 4. Hyperlipidemia - LDL goal < 70.  Continue statin.  I will get a copy of his last lipids from PCP. 5. Asymptomatic bradycardia - I suspect that this is related to exercise.  He is completely asymptomatic.  I have instructed him to let me  know if he develops any exertional fatigue, dizziness or syncope.     Medication Adjustments/Labs and Tests Ordered: Current medicines are reviewed at length with the patient today.  Concerns regarding medicines are outlined above.  Medication changes, Labs and Tests ordered today are listed in the Patient Instructions below.  There are no Patient Instructions on  file for this visit.   Signed, Fransico Him, MD  07/23/2016 11:39 AM    [place], [place], [place]  [address] Phone: [phone]; Fax: [phone]

## 2016-07-23 ENCOUNTER — Encounter: Payer: Self-pay | Admitting: Cardiology

## 2016-07-23 ENCOUNTER — Ambulatory Visit (INDEPENDENT_AMBULATORY_CARE_PROVIDER_SITE_OTHER): Payer: Medicare Other | Admitting: Cardiology

## 2016-07-23 VITALS — BP 118/66 | HR 43 | Ht 66.0 in | Wt 133.0 lb

## 2016-07-23 DIAGNOSIS — E785 Hyperlipidemia, unspecified: Secondary | ICD-10-CM | POA: Diagnosis not present

## 2016-07-23 DIAGNOSIS — I420 Dilated cardiomyopathy: Secondary | ICD-10-CM | POA: Diagnosis not present

## 2016-07-23 DIAGNOSIS — I2510 Atherosclerotic heart disease of native coronary artery without angina pectoris: Secondary | ICD-10-CM

## 2016-07-23 DIAGNOSIS — I493 Ventricular premature depolarization: Secondary | ICD-10-CM

## 2016-07-23 DIAGNOSIS — R001 Bradycardia, unspecified: Secondary | ICD-10-CM

## 2016-07-23 DIAGNOSIS — I2583 Coronary atherosclerosis due to lipid rich plaque: Principal | ICD-10-CM

## 2016-07-23 HISTORY — DX: Bradycardia, unspecified: R00.1

## 2016-07-23 MED ORDER — NITROGLYCERIN 0.4 MG SL SUBL: 0.4000 mg | SUBLINGUAL_TABLET | SUBLINGUAL | 1 refills | Status: DC | PRN

## 2016-07-23 NOTE — Patient Instructions (Signed)

## 2016-09-07 DIAGNOSIS — L814 Other melanin hyperpigmentation: Secondary | ICD-10-CM | POA: Diagnosis not present

## 2016-09-07 DIAGNOSIS — B353 Tinea pedis: Secondary | ICD-10-CM | POA: Diagnosis not present

## 2016-09-07 DIAGNOSIS — D235 Other benign neoplasm of skin of trunk: Secondary | ICD-10-CM | POA: Diagnosis not present

## 2016-09-10 DIAGNOSIS — F331 Major depressive disorder, recurrent, moderate: Secondary | ICD-10-CM | POA: Diagnosis not present

## 2016-09-14 DIAGNOSIS — E871 Hypo-osmolality and hyponatremia: Secondary | ICD-10-CM | POA: Diagnosis not present

## 2016-09-14 DIAGNOSIS — E875 Hyperkalemia: Secondary | ICD-10-CM | POA: Diagnosis not present

## 2016-09-14 DIAGNOSIS — Z5181 Encounter for therapeutic drug level monitoring: Secondary | ICD-10-CM | POA: Diagnosis not present

## 2016-09-14 DIAGNOSIS — E230 Hypopituitarism: Secondary | ICD-10-CM | POA: Diagnosis not present

## 2016-09-14 DIAGNOSIS — M8580 Other specified disorders of bone density and structure, unspecified site: Secondary | ICD-10-CM | POA: Diagnosis not present

## 2016-09-15 DIAGNOSIS — E230 Hypopituitarism: Secondary | ICD-10-CM | POA: Diagnosis not present

## 2016-09-15 DIAGNOSIS — Z5181 Encounter for therapeutic drug level monitoring: Secondary | ICD-10-CM | POA: Diagnosis not present

## 2016-09-15 DIAGNOSIS — E875 Hyperkalemia: Secondary | ICD-10-CM | POA: Diagnosis not present

## 2016-09-15 DIAGNOSIS — E871 Hypo-osmolality and hyponatremia: Secondary | ICD-10-CM | POA: Diagnosis not present

## 2016-10-07 DIAGNOSIS — F331 Major depressive disorder, recurrent, moderate: Secondary | ICD-10-CM | POA: Diagnosis not present

## 2016-11-04 DIAGNOSIS — F331 Major depressive disorder, recurrent, moderate: Secondary | ICD-10-CM | POA: Diagnosis not present

## 2016-11-05 DIAGNOSIS — F339 Major depressive disorder, recurrent, unspecified: Secondary | ICD-10-CM | POA: Diagnosis not present

## 2016-11-05 DIAGNOSIS — F411 Generalized anxiety disorder: Secondary | ICD-10-CM | POA: Diagnosis not present

## 2016-11-05 DIAGNOSIS — R7309 Other abnormal glucose: Secondary | ICD-10-CM | POA: Diagnosis not present

## 2016-11-05 DIAGNOSIS — E559 Vitamin D deficiency, unspecified: Secondary | ICD-10-CM | POA: Diagnosis not present

## 2016-11-05 DIAGNOSIS — I2510 Atherosclerotic heart disease of native coronary artery without angina pectoris: Secondary | ICD-10-CM | POA: Diagnosis not present

## 2016-11-05 DIAGNOSIS — L853 Xerosis cutis: Secondary | ICD-10-CM | POA: Diagnosis not present

## 2016-11-05 DIAGNOSIS — E7800 Pure hypercholesterolemia, unspecified: Secondary | ICD-10-CM | POA: Diagnosis not present

## 2016-12-09 DIAGNOSIS — F331 Major depressive disorder, recurrent, moderate: Secondary | ICD-10-CM | POA: Diagnosis not present

## 2017-01-13 DIAGNOSIS — F331 Major depressive disorder, recurrent, moderate: Secondary | ICD-10-CM | POA: Diagnosis not present

## 2017-01-21 ENCOUNTER — Encounter: Payer: Self-pay | Admitting: Cardiology

## 2017-01-21 ENCOUNTER — Ambulatory Visit (INDEPENDENT_AMBULATORY_CARE_PROVIDER_SITE_OTHER): Payer: Medicare Other | Admitting: Cardiology

## 2017-01-21 VITALS — BP 100/66 | HR 61 | Ht 66.0 in | Wt 136.4 lb

## 2017-01-21 DIAGNOSIS — I2510 Atherosclerotic heart disease of native coronary artery without angina pectoris: Secondary | ICD-10-CM | POA: Diagnosis not present

## 2017-01-21 DIAGNOSIS — I493 Ventricular premature depolarization: Secondary | ICD-10-CM

## 2017-01-21 DIAGNOSIS — I420 Dilated cardiomyopathy: Secondary | ICD-10-CM | POA: Diagnosis not present

## 2017-01-21 DIAGNOSIS — E7800 Pure hypercholesterolemia, unspecified: Secondary | ICD-10-CM | POA: Diagnosis not present

## 2017-01-21 MED ORDER — NITROGLYCERIN 0.4 MG SL SUBL: 0.4000 mg | SUBLINGUAL_TABLET | SUBLINGUAL | 1 refills | Status: DC | PRN

## 2017-01-21 NOTE — Patient Instructions (Signed)

## 2017-01-21 NOTE — Progress Notes (Signed)
Cardiology Office Note    Date:  01/21/2017   ID:  Patrick Barnes, DOB March 23, 1951, MRN [id number]  PCP:  Gerrit Heck, MD  Cardiologist:  Fransico Him, MD   Chief Complaint  Patient presents with  . Coronary Artery Disease  . Hypertension  . Hyperlipidemia    History of Present Illness:  Patrick Barnes is a 66 y.o. male with a history of ASCAD s/p PCI of LAD 2013, RVOT PVCs and VT s/p ablation, ischemic DCM EF 45-50% and dyslipidemia who presents today for followup. He denies any chest pain, SOB, DOE, LE edema, palpitations, dizziness, claudication, fatigue or syncope.     Past Medical History:  Diagnosis Date  . Anxiety   . Bradycardia 07/23/2016  . CAD (coronary artery disease) 01/2012   cardiac cath...with 80% stenosis in proximal LAD with PCI     . Dilated cardiomyopathy ([place])    Echo 11/2012 EF 45-50%  . Dyslipidemia   . Elevated CPK    Proboly due to excessive exercise.  . Elevated LFTs    history of-Now resolved  . H/O vasectomy   . Hx of tonsillectomy   . Hypogonadotropic hypogonadism (HCC)    Dr. Buddy Duty and Dr Jeffie Pollock  . Inguinal hernia   . Low bone density for age    endo is following  . Low testosterone   . PVC's (premature ventricular contractions)    s/p ablation Dr Rayann Heman and Dr Radford Pax  . Vitamin D deficiency     Past Surgical History:  Procedure Laterality Date  . CARDIAC CATHETERIZATION     w 80% stenosis in proimal LAD w PCI on 2/13.  [name] Kitchen COLONOSCOPY  10/2005   had internal nonbleeding small hemorrhoids,repeat in 10 years.  . CYSTECTOMY    . EP study and ablation for RVOT PVCs  08/12/12  . HERNIA REPAIR     Inguinal  . PERCUTANEOUS CORONARY STENT INTERVENTION (PCI-S) Bilateral 01/13/2012   Procedure: PERCUTANEOUS CORONARY STENT INTERVENTION (PCI-S);  Surgeon: Jettie Booze, MD;  Location: [facility] CATH LAB;  Service: Cardiovascular;  Laterality: Bilateral;  possible radial artery  . TONSILLECTOMY    . V-TACH ABLATION N/A 08/11/2012   Procedure: V-TACH ABLATION;  Surgeon: Thompson Grayer, MD;  Location: [facility] CATH LAB;  Service: Cardiovascular;  Laterality: N/A;  . VASECTOMY      Current Medications: Current Meds  Medication Sig  . acetaminophen (TYLENOL) 500 MG tablet Take 1,000 mg by mouth daily as needed. For sinus headaches  . BABY ASPIRIN PO Take 81 mg by mouth daily.  . DENTA 5000 PLUS 1.1 % CREA dental cream Place 1 application onto teeth at bedtime.   . MULTIPLE VITAMIN PO Take 1 tablet by mouth daily.  . nitroGLYCERIN (NITROSTAT) 0.4 MG SL tablet Place 1 tablet (0.4 mg total) under the tongue every 5 (five) minutes as needed. For chest pain  . ramipril (ALTACE) 5 MG capsule TAKE ONE CAPSULE BY MOUTH ONCE A DAY  . sertraline (ZOLOFT) 50 MG tablet Take 50 mg by mouth daily.  . simvastatin (ZOCOR) 40 MG tablet Take 40 mg by mouth daily.  . Testosterone (FORTESTA) 10 MG/ACT (2%) GEL Place 4 Squirts onto the skin daily.     Allergies:   Quinolones   Social History   Social History  . Marital status: Married    Spouse name: N/A  . Number of children: N/A  . Years of education: N/A   Occupational History  . retired    Social History Main Topics  .  Smoking status: Never Smoker  . Smokeless tobacco: Never Used  . Alcohol use Yes     Comment: occasional  . Drug use: No  . Sexual activity: Not Currently   Other Topics Concern  . None   Social History Narrative  . None     Family History:  The patient's family history includes Cancer in his mother.   ROS:   Please see the history of present illness.    ROS All other systems reviewed and are negative.  No flowsheet data found.     PHYSICAL EXAM:   VS:  BP 100/66   Pulse 61   Ht 5\' 6"  (1.676 m)   Wt 136 lb 6.4 oz (61.9 kg)   SpO2 99%   BMI 22.02 kg/m    GEN: Well nourished, well developed, in no acute distress  HEENT: normal  Neck: no JVD, carotid bruits, or masses Cardiac: RRR; no murmurs, rubs, or gallops,no edema.  Intact distal pulses  bilaterally.  Respiratory:  clear to auscultation bilaterally, normal work of breathing GI: soft, nontender, nondistended, + BS MS: no deformity or atrophy  Skin: warm and dry, no rash Neuro:  Alert and Oriented x 3, Strength and sensation are intact Psych: euthymic mood, full affect  Wt Readings from Last 3 Encounters:  01/21/17 136 lb 6.4 oz (61.9 kg)  07/23/16 133 lb (60.3 kg)  10/10/14 138 lb 12.8 oz (63 kg)      Studies/Labs Reviewed:   EKG:  EKG is not ordered today  Recent Labs: No results found for requested labs within last 8760 hours.   Lipid Panel    Component Value Date/Time   CHOL 137 10/16/2014 0740   TRIG 46.0 10/16/2014 0740   HDL 60.90 10/16/2014 0740   CHOLHDL 2 10/16/2014 0740   VLDL 9.2 10/16/2014 0740   LDLCALC 67 10/16/2014 0740    Additional studies/ records that were reviewed today include:  none    ASSESSMENT:    1. Coronary artery disease involving native coronary artery of native heart without angina pectoris   2. Dilated cardiomyopathy ([place])   3. PVC (premature ventricular contraction)   4. Pure hypercholesterolemia      PLAN:  In order of problems listed above:  1. ASCAD - s/p PCI of the LAD in 2013.  He has no anginal symptoms.  He will continue on ASA and statin.   2. DCM - EF 45-50% by echo 2014.  He will continue on ACE I.  No BB due to bradycardia. 3. PVCs/VT - s/p RVOT VT ablation.  He has not had any reoccurrence.   4. Hyperlipidemia with LDL goal < 70. He will continue on statin therapy.      Medication Adjustments/Labs and Tests Ordered: Current medicines are reviewed at length with the patient today.  Concerns regarding medicines are outlined above.  Medication changes, Labs and Tests ordered today are listed in the Patient Instructions below.  There are no Patient Instructions on file for this visit.   Signed, Fransico Him, MD  01/21/2017 8:34 AM    [address],  [place], [place]  [address] Phone: [phone]; Fax: [phone]

## 2017-02-10 DIAGNOSIS — F331 Major depressive disorder, recurrent, moderate: Secondary | ICD-10-CM | POA: Diagnosis not present

## 2017-02-22 DIAGNOSIS — Z23 Encounter for immunization: Secondary | ICD-10-CM | POA: Diagnosis not present

## 2017-02-22 DIAGNOSIS — Z7189 Other specified counseling: Secondary | ICD-10-CM | POA: Diagnosis not present

## 2017-03-10 DIAGNOSIS — F331 Major depressive disorder, recurrent, moderate: Secondary | ICD-10-CM | POA: Diagnosis not present

## 2017-03-11 ENCOUNTER — Encounter: Payer: Self-pay | Admitting: *Deleted

## 2017-03-11 DIAGNOSIS — Z006 Encounter for examination for normal comparison and control in clinical research program: Secondary | ICD-10-CM

## 2017-03-11 NOTE — Progress Notes (Signed)
Late entry: Spoke with patient for final EOS visit in the [employer] research study on 01/21/2017. Thanked patient for participating in study.  Jake Bathe, RN

## 2017-03-15 DIAGNOSIS — E875 Hyperkalemia: Secondary | ICD-10-CM | POA: Diagnosis not present

## 2017-03-15 DIAGNOSIS — E230 Hypopituitarism: Secondary | ICD-10-CM | POA: Diagnosis not present

## 2017-03-15 DIAGNOSIS — M8580 Other specified disorders of bone density and structure, unspecified site: Secondary | ICD-10-CM | POA: Diagnosis not present

## 2017-03-15 DIAGNOSIS — Z5181 Encounter for therapeutic drug level monitoring: Secondary | ICD-10-CM | POA: Diagnosis not present

## 2017-03-15 DIAGNOSIS — R7303 Prediabetes: Secondary | ICD-10-CM | POA: Diagnosis not present

## 2017-03-15 DIAGNOSIS — E871 Hypo-osmolality and hyponatremia: Secondary | ICD-10-CM | POA: Diagnosis not present

## 2017-04-07 ENCOUNTER — Encounter: Payer: Self-pay | Admitting: *Deleted

## 2017-04-07 DIAGNOSIS — Z006 Encounter for examination for normal comparison and control in clinical research program: Secondary | ICD-10-CM

## 2017-04-07 NOTE — Progress Notes (Signed)
Late entry: Subject ended follow up in the [place] on January 21, 2017.

## 2017-04-21 DIAGNOSIS — F331 Major depressive disorder, recurrent, moderate: Secondary | ICD-10-CM | POA: Diagnosis not present

## 2017-04-29 DIAGNOSIS — I2510 Atherosclerotic heart disease of native coronary artery without angina pectoris: Secondary | ICD-10-CM | POA: Diagnosis not present

## 2017-04-29 DIAGNOSIS — F411 Generalized anxiety disorder: Secondary | ICD-10-CM | POA: Diagnosis not present

## 2017-04-29 DIAGNOSIS — E7800 Pure hypercholesterolemia, unspecified: Secondary | ICD-10-CM | POA: Diagnosis not present

## 2017-04-29 DIAGNOSIS — F339 Major depressive disorder, recurrent, unspecified: Secondary | ICD-10-CM | POA: Diagnosis not present

## 2017-04-29 DIAGNOSIS — E559 Vitamin D deficiency, unspecified: Secondary | ICD-10-CM | POA: Diagnosis not present

## 2017-04-29 DIAGNOSIS — R7309 Other abnormal glucose: Secondary | ICD-10-CM | POA: Diagnosis not present

## 2017-05-26 DIAGNOSIS — F331 Major depressive disorder, recurrent, moderate: Secondary | ICD-10-CM | POA: Diagnosis not present

## 2017-06-30 DIAGNOSIS — F331 Major depressive disorder, recurrent, moderate: Secondary | ICD-10-CM | POA: Diagnosis not present

## 2017-08-04 DIAGNOSIS — F331 Major depressive disorder, recurrent, moderate: Secondary | ICD-10-CM | POA: Diagnosis not present

## 2017-09-02 DIAGNOSIS — E559 Vitamin D deficiency, unspecified: Secondary | ICD-10-CM | POA: Diagnosis not present

## 2017-09-02 DIAGNOSIS — E230 Hypopituitarism: Secondary | ICD-10-CM | POA: Diagnosis not present

## 2017-09-02 DIAGNOSIS — Z5181 Encounter for therapeutic drug level monitoring: Secondary | ICD-10-CM | POA: Diagnosis not present

## 2017-09-02 DIAGNOSIS — R7301 Impaired fasting glucose: Secondary | ICD-10-CM | POA: Diagnosis not present

## 2017-09-06 DIAGNOSIS — F339 Major depressive disorder, recurrent, unspecified: Secondary | ICD-10-CM | POA: Diagnosis not present

## 2017-09-06 DIAGNOSIS — R7309 Other abnormal glucose: Secondary | ICD-10-CM | POA: Diagnosis not present

## 2017-09-06 DIAGNOSIS — E7800 Pure hypercholesterolemia, unspecified: Secondary | ICD-10-CM | POA: Diagnosis not present

## 2017-09-06 DIAGNOSIS — M8580 Other specified disorders of bone density and structure, unspecified site: Secondary | ICD-10-CM | POA: Diagnosis not present

## 2017-09-06 DIAGNOSIS — E230 Hypopituitarism: Secondary | ICD-10-CM | POA: Diagnosis not present

## 2017-09-06 DIAGNOSIS — Z1389 Encounter for screening for other disorder: Secondary | ICD-10-CM | POA: Diagnosis not present

## 2017-09-06 DIAGNOSIS — Z0000 Encounter for general adult medical examination without abnormal findings: Secondary | ICD-10-CM | POA: Diagnosis not present

## 2017-09-06 DIAGNOSIS — F411 Generalized anxiety disorder: Secondary | ICD-10-CM | POA: Diagnosis not present

## 2017-09-06 DIAGNOSIS — E559 Vitamin D deficiency, unspecified: Secondary | ICD-10-CM | POA: Diagnosis not present

## 2017-09-06 DIAGNOSIS — N400 Enlarged prostate without lower urinary tract symptoms: Secondary | ICD-10-CM | POA: Diagnosis not present

## 2017-09-15 DIAGNOSIS — F331 Major depressive disorder, recurrent, moderate: Secondary | ICD-10-CM | POA: Diagnosis not present

## 2017-09-20 DIAGNOSIS — L814 Other melanin hyperpigmentation: Secondary | ICD-10-CM | POA: Diagnosis not present

## 2017-09-20 DIAGNOSIS — D225 Melanocytic nevi of trunk: Secondary | ICD-10-CM | POA: Diagnosis not present

## 2017-09-20 DIAGNOSIS — L821 Other seborrheic keratosis: Secondary | ICD-10-CM | POA: Diagnosis not present

## 2017-10-13 DIAGNOSIS — F331 Major depressive disorder, recurrent, moderate: Secondary | ICD-10-CM | POA: Diagnosis not present

## 2017-11-10 DIAGNOSIS — H5213 Myopia, bilateral: Secondary | ICD-10-CM | POA: Diagnosis not present

## 2017-11-10 DIAGNOSIS — F331 Major depressive disorder, recurrent, moderate: Secondary | ICD-10-CM | POA: Diagnosis not present

## 2017-12-08 DIAGNOSIS — F331 Major depressive disorder, recurrent, moderate: Secondary | ICD-10-CM | POA: Diagnosis not present

## 2017-12-10 DIAGNOSIS — E230 Hypopituitarism: Secondary | ICD-10-CM | POA: Diagnosis not present

## 2017-12-10 DIAGNOSIS — M8580 Other specified disorders of bone density and structure, unspecified site: Secondary | ICD-10-CM | POA: Diagnosis not present

## 2017-12-10 DIAGNOSIS — Z125 Encounter for screening for malignant neoplasm of prostate: Secondary | ICD-10-CM | POA: Diagnosis not present

## 2017-12-10 DIAGNOSIS — Z5181 Encounter for therapeutic drug level monitoring: Secondary | ICD-10-CM | POA: Diagnosis not present

## 2018-01-30 NOTE — Progress Notes (Signed)
Cardiology Office Note:    Date:  02/01/2018   ID:  Patrick Barnes, DOB October 24, 1951, MRN [id number]  PCP:  Leighton Ruff, MD  Cardiologist:  No primary care provider on file.    Referring MD: Leighton Ruff, MD   Chief Complaint  Patient presents with  . Coronary Artery Disease  . Cardiomyopathy    History of Present Illness:    Patrick Barnes is a 67 y.o. male with a hx of ASCAD s/p PCI of LAD 2013, RVOT PVCs and VT s/p ablation, ischemic DCM EF 45-50%and dyslipidemia.  He is here today for followup and is doing well.  He denies any chest pain or pressure, SOB, DOE, PND, orthopnea, LE edema, dizziness, palpitations or syncope. He is compliant with his meds and is tolerating meds with no SE.      Past Medical History:  Diagnosis Date  . Anxiety   . Bradycardia 07/23/2016  . CAD (coronary artery disease) 01/2012   cardiac cath...with 80% stenosis in proximal LAD with PCI     . Dilated cardiomyopathy ([place])    Echo 11/2012 EF 45-50%  . Dyslipidemia   . Elevated CPK    Proboly due to excessive exercise.  . Elevated LFTs    history of-Now resolved  . H/O vasectomy   . Hx of tonsillectomy   . Hypogonadotropic hypogonadism (HCC)    Dr. Buddy Duty and Dr Jeffie Pollock  . Inguinal hernia   . Low bone density for age    endo is following  . Low testosterone   . PVC's (premature ventricular contractions)    s/p ablation Dr Rayann Heman and Dr Radford Pax  . RBBB   . Vitamin D deficiency     Past Surgical History:  Procedure Laterality Date  . CARDIAC CATHETERIZATION     w 80% stenosis in proimal LAD w PCI on 2/13.  [name] Kitchen COLONOSCOPY  10/2005   had internal nonbleeding small hemorrhoids,repeat in 10 years.  . CYSTECTOMY    . EP study and ablation for RVOT PVCs  08/12/12  . HERNIA REPAIR     Inguinal  . PERCUTANEOUS CORONARY STENT INTERVENTION (PCI-S) Bilateral 01/13/2012   Procedure: PERCUTANEOUS CORONARY STENT INTERVENTION (PCI-S);  Surgeon: Jettie Booze, MD;  Location: [facility] CATH LAB;  Service:  Cardiovascular;  Laterality: Bilateral;  possible radial artery  . TONSILLECTOMY    . V-TACH ABLATION N/A 08/11/2012   Procedure: V-TACH ABLATION;  Surgeon: Thompson Grayer, MD;  Location: [facility] CATH LAB;  Service: Cardiovascular;  Laterality: N/A;  . VASECTOMY      Current Medications: Current Meds  Medication Sig  . acetaminophen (TYLENOL) 500 MG tablet Take 1,000 mg by mouth daily as needed. For sinus headaches  . BABY ASPIRIN PO Take 81 mg by mouth daily.  . DENTA 5000 PLUS 1.1 % CREA dental cream Place 1 application onto teeth at bedtime.   . MULTIPLE VITAMIN PO Take 1 tablet by mouth daily.  . nitroGLYCERIN (NITROSTAT) 0.4 MG SL tablet Place 1 tablet (0.4 mg total) under the tongue every 5 (five) minutes as needed. For chest pain  . ramipril (ALTACE) 5 MG capsule TAKE ONE CAPSULE BY MOUTH ONCE A DAY  . sertraline (ZOLOFT) 50 MG tablet Take 50 mg by mouth daily.  . simvastatin (ZOCOR) 40 MG tablet Take 40 mg by mouth daily.  . Testosterone (FORTESTA) 10 MG/ACT (2%) GEL Place 4 Squirts onto the skin daily.      Allergies:   Quinolones   Social History   Socioeconomic History  .  Marital status: Married    Spouse name: None  . Number of children: None  . Years of education: None  . Highest education level: None  Social Needs  . Financial resource strain: None  . Food insecurity - worry: None  . Food insecurity - inability: None  . Transportation needs - medical: None  . Transportation needs - non-medical: None  Occupational History  . Occupation: retired  Tobacco Use  . Smoking status: Never Smoker  . Smokeless tobacco: Never Used  Substance and Sexual Activity  . Alcohol use: Yes    Comment: occasional  . Drug use: No  . Sexual activity: Not Currently  Other Topics Concern  . None  Social History Narrative  . None     Family History: The patient's family history includes Cancer in his mother.  ROS:   Please see the history of present illness.    ROS  All other  systems reviewed and negative.   EKGs/Labs/Other Studies Reviewed:    The following studies were reviewed today: none  EKG:  EKG is  ordered today.  The ekg ordered today demonstrates sinus bradycardia at 44bpm with RBBB  Recent Labs: No results found for requested labs within last 8760 hours.   Recent Lipid Panel    Component Value Date/Time   CHOL 137 10/16/2014 0740   TRIG 46.0 10/16/2014 0740   HDL 60.90 10/16/2014 0740   CHOLHDL 2 10/16/2014 0740   VLDL 9.2 10/16/2014 0740   LDLCALC 67 10/16/2014 0740    Physical Exam:    VS:  BP 120/74   Pulse (!) 44   Ht 5\' 5"  (1.651 m)   Wt 134 lb 12.8 oz (61.1 kg)   BMI 22.43 kg/m     Wt Readings from Last 3 Encounters:  02/01/18 134 lb 12.8 oz (61.1 kg)  01/21/17 136 lb 6.4 oz (61.9 kg)  07/23/16 133 lb (60.3 kg)     GEN:  Well nourished, well developed in no acute distress HEENT: Normal NECK: No JVD; No carotid bruits LYMPHATICS: No lymphadenopathy CARDIAC: RRR, no murmurs, rubs, gallops RESPIRATORY:  Clear to auscultation without rales, wheezing or rhonchi  ABDOMEN: Soft, non-tender, non-distended MUSCULOSKELETAL:  No edema; No deformity  SKIN: Warm and dry NEUROLOGIC:  Alert and oriented x 3 PSYCHIATRIC:  Normal affect   ASSESSMENT:    1. Coronary artery disease involving native coronary artery of native heart without angina pectoris   2. Dilated cardiomyopathy ([place])   3. PVC (premature ventricular contraction)   4. Pure hypercholesterolemia   5. RBBB    PLAN:    In order of problems listed above:  1.  ASCAD -  s/p PCI of LAD 2013.  He denies any anginal sx.  He rides his elliptical for an hour daily and denies any exertional fatigue or CP/SOB.  He will continue on ASA 81mg  daily.    2.  DCM - EF 45-50% by echo 2014.  I am going to repeat a 2D echocardiogram to recent assess LV function since it is been several years.  He will continue ramipril 5 mg daily.  His creatinine was 0.9 on 09/02/2017  3.  PVCs  and VT s/p ablation -he has not had any further ventricular tachycardia.  He denies any palpitations.  He is bradycardic on exam but is asymptomatic.    4.  Hyperlipidemia with LDL goal < 70.  He will continue on simvastatin 40 mg daily.  LDL was 40 on 09/02/2017  5.  RBBB - he is asymptomatic and is very active riding his elliptical 60min daily.  I am going to get a stress myoview to rule out ischemia since RBBB is new  Medication Adjustments/Labs and Tests Ordered: Current medicines are reviewed at length with the patient today.  Concerns regarding medicines are outlined above.  No orders of the defined types were placed in this encounter.  No orders of the defined types were placed in this encounter.   Signed, Fransico Him, MD  02/01/2018 10:06 AM    [place]

## 2018-02-01 ENCOUNTER — Ambulatory Visit (INDEPENDENT_AMBULATORY_CARE_PROVIDER_SITE_OTHER): Payer: Medicare Other | Admitting: Cardiology

## 2018-02-01 ENCOUNTER — Encounter: Payer: Self-pay | Admitting: Cardiology

## 2018-02-01 VITALS — BP 120/74 | HR 44 | Ht 65.0 in | Wt 134.8 lb

## 2018-02-01 DIAGNOSIS — I4510 Unspecified right bundle-branch block: Secondary | ICD-10-CM | POA: Insufficient documentation

## 2018-02-01 DIAGNOSIS — I420 Dilated cardiomyopathy: Secondary | ICD-10-CM | POA: Diagnosis not present

## 2018-02-01 DIAGNOSIS — E7800 Pure hypercholesterolemia, unspecified: Secondary | ICD-10-CM

## 2018-02-01 DIAGNOSIS — I493 Ventricular premature depolarization: Secondary | ICD-10-CM | POA: Diagnosis not present

## 2018-02-01 DIAGNOSIS — I2510 Atherosclerotic heart disease of native coronary artery without angina pectoris: Secondary | ICD-10-CM

## 2018-02-01 NOTE — Patient Instructions (Signed)
Medication Instructions:  Your provider recommends that you continue on your current medications as directed. Please refer to the Current Medication list given to you today.    Labwork: None  Testing/Procedures: Your provider has requested that you have an echocardiogram. Echocardiography is a painless test that uses sound waves to create images of your heart. It provides your doctor with information about the size and shape of your heart and how well your heart's chambers and valves are working. This procedure takes approximately one hour. There are no restrictions for this procedure.    Dr. Radford Pax recommends you have a NUCLEAR STRESS TEST.  Follow-Up: Your provider wants you to follow-up in: 1 year with Dr. Radford Pax. You will receive a reminder letter in the mail two months in advance. If you don't receive a letter, please call our office to schedule the follow-up appointment.    Any Other Special Instructions Will Be Listed Below (If Applicable).     If you need a refill on your cardiac medications before your next appointment, please call your pharmacy.

## 2018-02-03 ENCOUNTER — Telehealth (HOSPITAL_COMMUNITY): Payer: Self-pay | Admitting: *Deleted

## 2018-02-03 NOTE — Telephone Encounter (Signed)
Left message on voicemail per DPR in reference to upcoming appointment scheduled on 02/08/18 with detailed instructions given per Myocardial Perfusion Study Information Sheet for the test. LM to arrive 15 minutes early, and that it is imperative to arrive on time for appointment to keep from having the test rescheduled. If you need to cancel or reschedule your appointment, please call the office within 24 hours of your appointment. Failure to do so may result in a cancellation of your appointment, and a $50 no show fee. Phone number given for call back for any questions. Kirstie Peri

## 2018-02-08 ENCOUNTER — Ambulatory Visit (HOSPITAL_COMMUNITY): Payer: Medicare Other | Attending: Cardiovascular Disease

## 2018-02-08 ENCOUNTER — Ambulatory Visit (HOSPITAL_BASED_OUTPATIENT_CLINIC_OR_DEPARTMENT_OTHER): Payer: Medicare Other

## 2018-02-08 ENCOUNTER — Other Ambulatory Visit: Payer: Self-pay

## 2018-02-08 DIAGNOSIS — I420 Dilated cardiomyopathy: Secondary | ICD-10-CM | POA: Insufficient documentation

## 2018-02-08 DIAGNOSIS — I5030 Unspecified diastolic (congestive) heart failure: Secondary | ICD-10-CM | POA: Insufficient documentation

## 2018-02-08 DIAGNOSIS — I4510 Unspecified right bundle-branch block: Secondary | ICD-10-CM

## 2018-02-08 DIAGNOSIS — I2510 Atherosclerotic heart disease of native coronary artery without angina pectoris: Secondary | ICD-10-CM | POA: Diagnosis not present

## 2018-02-08 LAB — MYOCARDIAL PERFUSION IMAGING
CHL CUP NUCLEAR SRS: 5
CHL RATE OF PERCEIVED EXERTION: 18
CSEPHR: 71 %
Estimated workload: 10.1 METS
Exercise duration (min): 8 min
LV dias vol: 143 mL (ref 62–150)
LVSYSVOL: 82 mL
MPHR: 153 {beats}/min
Peak HR: 109 {beats}/min
RATE: 0.3
Rest HR: 41 {beats}/min
SDS: 1
SSS: 6
TID: 1.01

## 2018-02-08 MED ORDER — REGADENOSON 0.4 MG/5ML IV SOLN
0.4000 mg | Freq: Once | INTRAVENOUS | Status: AC
  Administered 2018-02-08: 0.4 mg via INTRAVENOUS

## 2018-02-08 MED ORDER — TECHNETIUM TC 99M TETROFOSMIN IV KIT
32.6000 | PACK | Freq: Once | INTRAVENOUS | Status: AC | PRN
  Administered 2018-02-08: 32.6 via INTRAVENOUS
  Filled 2018-02-08: qty 33

## 2018-02-08 MED ORDER — TECHNETIUM TC 99M TETROFOSMIN IV KIT
10.3000 | PACK | Freq: Once | INTRAVENOUS | Status: AC | PRN
  Administered 2018-02-08: 10.3 via INTRAVENOUS
  Filled 2018-02-08: qty 11

## 2018-02-09 ENCOUNTER — Telehealth: Payer: Self-pay | Admitting: Cardiology

## 2018-02-09 DIAGNOSIS — I2699 Other pulmonary embolism without acute cor pulmonale: Secondary | ICD-10-CM

## 2018-02-09 NOTE — Telephone Encounter (Signed)
I spoke with Patrick Barnes's wife (dpr on file). I explained to spouse that Dr. Radford Pax is recommending PFT to evaluate lung disease and VQ scan to assess for silent PE. She states she understands the need for further testing but she is concerned that Patrick Barnes's EF could be related to his new RBBB. She would like to know if he needs to be treated and if Patrick Barnes needs another heart cath because in 2013 Patrick Barnes had a heart cath and found some blockages even with a negative stress test. I informed her I would forward these concerns to Dr. Radford Pax to review and for further recommendations. She verbalized understanding and very thankful for the call.

## 2018-02-09 NOTE — Telephone Encounter (Signed)
His LVF is similar to what it was in 2014 but the right sided enlargement is new and not related to RBBB.  No indication for cath unless nuclear stress tests abnormal but does need the VQ scan and PFTs to look for abnormalities with the lungs that could cause th right side of heart to enlarge.

## 2018-02-09 NOTE — Telephone Encounter (Signed)
New message    Please call his Wife Gae Bon to go over his test results , she is a Marine scientist and will understand what you are talking about.

## 2018-02-10 NOTE — Telephone Encounter (Signed)
I spoke with patient's spouse, I reiterated the need to continue with VQ scan and PFT to assess for abnormalities with the lungs and no need for heart cath unless stress test is abnormal. She verbalized understanding and thankful for the call.

## 2018-02-25 ENCOUNTER — Ambulatory Visit (HOSPITAL_COMMUNITY)
Admission: RE | Admit: 2018-02-25 | Discharge: 2018-02-25 | Disposition: A | Discharge status: Home / Self Care | Payer: Medicare Other | Source: Ambulatory Visit | Attending: Cardiology | Admitting: Cardiology

## 2018-02-25 ENCOUNTER — Encounter (HOSPITAL_COMMUNITY)
Admission: RE | Admit: 2018-02-25 | Discharge: 2018-02-25 | Disposition: A | Discharge status: Home / Self Care | Payer: Medicare Other | Source: Ambulatory Visit | Attending: Cardiology | Admitting: Cardiology

## 2018-02-25 DIAGNOSIS — I2699 Other pulmonary embolism without acute cor pulmonale: Secondary | ICD-10-CM | POA: Insufficient documentation

## 2018-02-25 DIAGNOSIS — J984 Other disorders of lung: Secondary | ICD-10-CM | POA: Insufficient documentation

## 2018-02-25 DIAGNOSIS — J449 Chronic obstructive pulmonary disease, unspecified: Secondary | ICD-10-CM | POA: Insufficient documentation

## 2018-02-25 DIAGNOSIS — R0602 Shortness of breath: Secondary | ICD-10-CM | POA: Diagnosis not present

## 2018-02-25 LAB — PULMONARY FUNCTION TEST
DL/VA % pred: 100 %
DL/VA: 4.26 ml/min/mmHg/L
DLCO unc % pred: 88 %
DLCO unc: 22.75 ml/min/mmHg
FEF 25-75 Post: 2.66 L/sec
FEF 25-75 Pre: 1.84 L/sec
FEF2575-%Change-Post: 44 %
FEF2575-%PRED-PRE: 86 %
FEF2575-%Pred-Post: 125 %
FEV1-%Change-Post: 6 %
FEV1-%PRED-POST: 109 %
FEV1-%PRED-PRE: 102 %
FEV1-POST: 2.96 L
FEV1-Pre: 2.77 L
FEV1FVC-%Change-Post: 6 %
FEV1FVC-%Pred-Pre: 95 %
FEV6-%Change-Post: 0 %
FEV6-%PRED-PRE: 112 %
FEV6-%Pred-Post: 113 %
FEV6-POST: 3.92 L
FEV6-Pre: 3.9 L
FEV6FVC-%Change-Post: 0 %
FEV6FVC-%PRED-POST: 106 %
FEV6FVC-%Pred-Pre: 105 %
FVC-%Change-Post: 0 %
FVC-%Pred-Post: 106 %
FVC-%Pred-Pre: 106 %
FVC-Post: 3.92 L
FVC-Pre: 3.91 L
POST FEV6/FVC RATIO: 100 %
PRE FEV1/FVC RATIO: 71 %
PRE FEV6/FVC RATIO: 100 %
Post FEV1/FVC ratio: 75 %
RV % pred: 160 %
RV: 3.42 L
TLC % PRED: 120 %
TLC: 7.26 L

## 2018-02-25 MED ORDER — TECHNETIUM TO 99M ALBUMIN AGGREGATED
4.2000 | Freq: Once | INTRAVENOUS | Status: AC | PRN
  Administered 2018-02-25: 4.2 via INTRAVENOUS

## 2018-02-25 MED ORDER — TECHNETIUM TC 99M DIETHYLENETRIAME-PENTAACETIC ACID
32.2000 | Freq: Once | INTRAVENOUS | Status: AC | PRN
Start: 2018-02-25 — End: 2018-02-25
  Administered 2018-02-25: 32.2 via RESPIRATORY_TRACT

## 2018-02-25 MED ORDER — ALBUTEROL SULFATE (2.5 MG/3ML) 0.083% IN NEBU
2.5000 mg | INHALATION_SOLUTION | Freq: Once | RESPIRATORY_TRACT | Status: AC
  Administered 2018-02-25: 2.5 mg via RESPIRATORY_TRACT

## 2018-03-08 DIAGNOSIS — E559 Vitamin D deficiency, unspecified: Secondary | ICD-10-CM | POA: Diagnosis not present

## 2018-03-08 DIAGNOSIS — F411 Generalized anxiety disorder: Secondary | ICD-10-CM | POA: Diagnosis not present

## 2018-03-08 DIAGNOSIS — E7800 Pure hypercholesterolemia, unspecified: Secondary | ICD-10-CM | POA: Diagnosis not present

## 2018-03-08 DIAGNOSIS — F339 Major depressive disorder, recurrent, unspecified: Secondary | ICD-10-CM | POA: Diagnosis not present

## 2018-03-08 DIAGNOSIS — R7309 Other abnormal glucose: Secondary | ICD-10-CM | POA: Diagnosis not present

## 2018-03-08 DIAGNOSIS — I2510 Atherosclerotic heart disease of native coronary artery without angina pectoris: Secondary | ICD-10-CM | POA: Diagnosis not present

## 2018-03-17 DIAGNOSIS — D1801 Hemangioma of skin and subcutaneous tissue: Secondary | ICD-10-CM | POA: Diagnosis not present

## 2018-03-17 DIAGNOSIS — D485 Neoplasm of uncertain behavior of skin: Secondary | ICD-10-CM | POA: Diagnosis not present

## 2018-03-17 DIAGNOSIS — L821 Other seborrheic keratosis: Secondary | ICD-10-CM | POA: Diagnosis not present

## 2018-03-17 DIAGNOSIS — D225 Melanocytic nevi of trunk: Secondary | ICD-10-CM | POA: Diagnosis not present

## 2018-03-17 DIAGNOSIS — L989 Disorder of the skin and subcutaneous tissue, unspecified: Secondary | ICD-10-CM | POA: Diagnosis not present

## 2018-05-04 DIAGNOSIS — E871 Hypo-osmolality and hyponatremia: Secondary | ICD-10-CM | POA: Diagnosis not present

## 2018-05-04 DIAGNOSIS — E559 Vitamin D deficiency, unspecified: Secondary | ICD-10-CM | POA: Diagnosis not present

## 2018-05-22 ENCOUNTER — Other Ambulatory Visit: Payer: Self-pay | Admitting: Cardiology

## 2018-07-05 DIAGNOSIS — E559 Vitamin D deficiency, unspecified: Secondary | ICD-10-CM | POA: Diagnosis not present

## 2018-07-05 DIAGNOSIS — Z125 Encounter for screening for malignant neoplasm of prostate: Secondary | ICD-10-CM | POA: Diagnosis not present

## 2018-07-05 DIAGNOSIS — M810 Age-related osteoporosis without current pathological fracture: Secondary | ICD-10-CM | POA: Diagnosis not present

## 2018-07-05 DIAGNOSIS — Z79899 Other long term (current) drug therapy: Secondary | ICD-10-CM | POA: Diagnosis not present

## 2018-07-05 DIAGNOSIS — E230 Hypopituitarism: Secondary | ICD-10-CM | POA: Diagnosis not present

## 2018-07-05 DIAGNOSIS — R7309 Other abnormal glucose: Secondary | ICD-10-CM | POA: Diagnosis not present

## 2018-09-05 ENCOUNTER — Telehealth: Payer: Self-pay | Admitting: Radiation Oncology

## 2018-09-06 NOTE — Telephone Encounter (Signed)
Opened in error

## 2018-09-20 DIAGNOSIS — E7800 Pure hypercholesterolemia, unspecified: Secondary | ICD-10-CM | POA: Diagnosis not present

## 2018-09-20 DIAGNOSIS — R1310 Dysphagia, unspecified: Secondary | ICD-10-CM | POA: Diagnosis not present

## 2018-09-20 DIAGNOSIS — F339 Major depressive disorder, recurrent, unspecified: Secondary | ICD-10-CM | POA: Diagnosis not present

## 2018-09-20 DIAGNOSIS — D649 Anemia, unspecified: Secondary | ICD-10-CM | POA: Diagnosis not present

## 2018-09-20 DIAGNOSIS — Z0000 Encounter for general adult medical examination without abnormal findings: Secondary | ICD-10-CM | POA: Diagnosis not present

## 2018-09-20 DIAGNOSIS — R7309 Other abnormal glucose: Secondary | ICD-10-CM | POA: Diagnosis not present

## 2018-09-20 DIAGNOSIS — E230 Hypopituitarism: Secondary | ICD-10-CM | POA: Diagnosis not present

## 2018-09-20 DIAGNOSIS — I2510 Atherosclerotic heart disease of native coronary artery without angina pectoris: Secondary | ICD-10-CM | POA: Diagnosis not present

## 2018-09-20 DIAGNOSIS — F411 Generalized anxiety disorder: Secondary | ICD-10-CM | POA: Diagnosis not present

## 2018-09-20 DIAGNOSIS — E559 Vitamin D deficiency, unspecified: Secondary | ICD-10-CM | POA: Diagnosis not present

## 2018-09-21 DIAGNOSIS — L821 Other seborrheic keratosis: Secondary | ICD-10-CM | POA: Diagnosis not present

## 2018-09-21 DIAGNOSIS — D1801 Hemangioma of skin and subcutaneous tissue: Secondary | ICD-10-CM | POA: Diagnosis not present

## 2018-09-21 DIAGNOSIS — L814 Other melanin hyperpigmentation: Secondary | ICD-10-CM | POA: Diagnosis not present

## 2018-09-21 DIAGNOSIS — D229 Melanocytic nevi, unspecified: Secondary | ICD-10-CM | POA: Diagnosis not present

## 2018-09-21 DIAGNOSIS — L819 Disorder of pigmentation, unspecified: Secondary | ICD-10-CM | POA: Diagnosis not present

## 2018-10-06 DIAGNOSIS — R1310 Dysphagia, unspecified: Secondary | ICD-10-CM | POA: Diagnosis not present

## 2018-11-22 DIAGNOSIS — R1310 Dysphagia, unspecified: Secondary | ICD-10-CM | POA: Diagnosis not present

## 2018-12-22 ENCOUNTER — Encounter: Payer: Self-pay | Admitting: Cardiology

## 2019-01-18 ENCOUNTER — Encounter: Payer: Self-pay | Admitting: Cardiology

## 2019-02-06 ENCOUNTER — Ambulatory Visit: Payer: Self-pay | Admitting: Cardiology

## 2019-02-07 ENCOUNTER — Encounter: Payer: Self-pay | Admitting: Cardiology

## 2019-02-07 ENCOUNTER — Ambulatory Visit (INDEPENDENT_AMBULATORY_CARE_PROVIDER_SITE_OTHER): Payer: Medicare Other | Admitting: Cardiology

## 2019-02-07 VITALS — BP 120/62 | HR 70 | Ht 65.5 in | Wt 132.4 lb

## 2019-02-07 DIAGNOSIS — I25119 Atherosclerotic heart disease of native coronary artery with unspecified angina pectoris: Secondary | ICD-10-CM

## 2019-02-07 DIAGNOSIS — I493 Ventricular premature depolarization: Secondary | ICD-10-CM

## 2019-02-07 NOTE — Patient Instructions (Signed)
Medication Instructions:  NONE If you need a refill on your cardiac medications before your next appointment, please call your pharmacy.   Lab work: NONE If you have labs (blood work) drawn today and your tests are completely normal, you will receive your results only by: [name] Kitchen MyChart Message (if you have MyChart) OR . A paper copy in the mail If you have any lab test that is abnormal or we need to change your treatment, we will call you to review the results.  Testing/Procedures: NONE  Follow-Up: At [facility], you and your health needs are our priority.  As part of our continuing mission to provide you with exceptional heart care, we have created designated Provider Care Teams.  These Care Teams include your primary Cardiologist (physician) and Advanced Practice Providers (APPs -  Physician Assistants and Nurse Practitioners) who all work together to provide you with the care you need, when you need it. You will need a follow up appointment in 1 years.  Please call our office 2 months in advance to schedule this appointment.  You may see Patrick Barnes or one of the following Advanced Practice Providers on your designated Care Team:   Patrick Jester, PA-C Patrick Copa, PA-C . Patrick Barrios, PA-C  Any Other Special Instructions Will Be Listed Below (If Applicable).

## 2019-02-07 NOTE — Progress Notes (Signed)
02/07/2019 Patrick Barnes   [birth year]  [id number]  Primary Physician Leighton Ruff, MD Primary Cardiologist: No primary care provider on file.  Electrophysiologist: Dr. Rayann Heman  Reason for Visit/CC: yearly f/u for CAD  HPI: Patrick Barnes is a 68 y.o. male with a hx of ASCAD s/p PCI of LAD 2013, RVOT PVCs and VT s/p ablation, ischemic DCM EF45-50%and dyslipidemia.  He is followed by Dr. Radford Pax and presents to clinic today for his yearly follow-up.  Pt reports that they have done well from a symptom standpoint since their last office visit. They deny chest pain and dyspnea. No exertional symptoms w/ exercise or ADLs. Pt also denies orthopnea, PND, LEE, palpitations, dizziness, syncope/ near syncope and claudication.   They report full medication compliance. No intolerances or medication side effects. Pt also denies any hospitalizations,  ED/urgent care visits or surgeries since their last office visit.    Patient remains active.  He does 1 hour of aerobic exercise daily, mainly elliptical and statonary bicycle.  He no longer runs to avoid impact on his knees.  He also reports eating a diet rich in fruits and vegetables.  Does not eat a lot of meat, mostly chicken, grilled or baked.  EKG today shows known right bundle branch block and sinus bradycardia.  Heart rate 56 bpm. Asymptomatic w/ his bradycardia.  Blood pressure is well controlled at 120/62.  PCP checks lipids.  Recent lipid panel showed LDL of 75 mg/dL.   Current Meds  Medication Sig  . acetaminophen (TYLENOL) 500 MG tablet Take 1,000 mg by mouth daily as needed. For sinus headaches  . BABY ASPIRIN PO Take 81 mg by mouth daily.  . cholecalciferol (VITAMIN D3) 25 MCG (1000 UT) tablet Take 1,000 Units by mouth daily.  . DENTA 5000 PLUS 1.1 % CREA dental cream Place 1 application onto teeth at bedtime.   . Melatonin 3 MG CAPS Take 3 mg by mouth daily as needed.  . MULTIPLE VITAMIN PO Take 1 tablet by mouth daily.  .  nitroGLYCERIN (NITROSTAT) 0.4 MG SL tablet PLACE 1 TABLET UNDER TONGUE EVERY 5 MINUTES AS NEEDED FOR CHEST PAIN.[name] Kitchen MAXIMUM OF 3 DOSES  . ramipril (ALTACE) 2.5 MG capsule Take 2.5 mg by mouth daily.   . sertraline (ZOLOFT) 50 MG tablet Take 50 mg by mouth daily.  . simvastatin (ZOCOR) 40 MG tablet Take 40 mg by mouth daily.  . Testosterone (FORTESTA) 10 MG/ACT (2%) GEL Place 4 Squirts onto the skin daily.    Allergies  Allergen Reactions  . Quinolones     Prolonged QT   Past Medical History:  Diagnosis Date  . Anxiety   . Bradycardia 07/23/2016  . CAD (coronary artery disease) 01/2012   cardiac cath...with 80% stenosis in proximal LAD with PCI     . Dilated cardiomyopathy ([place])    Echo 11/2012 EF 45-50%  . Dyslipidemia   . Elevated CPK    Proboly due to excessive exercise.  . Elevated LFTs    history of-Now resolved  . H/O vasectomy   . Hx of tonsillectomy   . Hypogonadotropic hypogonadism (HCC)    Dr. Buddy Duty and Dr Jeffie Pollock  . Inguinal hernia   . Low bone density for age    endo is following  . Low testosterone   . PVC's (premature ventricular contractions)    s/p ablation Dr Rayann Heman and Dr Radford Pax  . RBBB   . Vitamin D deficiency    Family History  Problem Relation Age of Onset  .  Cancer Mother        died at 41yo   Past Surgical History:  Procedure Laterality Date  . CARDIAC CATHETERIZATION     w 80% stenosis in proimal LAD w PCI on 2/13.  [name] Kitchen COLONOSCOPY  10/2005   had internal nonbleeding small hemorrhoids,repeat in 10 years.  . CYSTECTOMY    . EP study and ablation for RVOT PVCs  08/12/12  . HERNIA REPAIR     Inguinal  . PERCUTANEOUS CORONARY STENT INTERVENTION (PCI-S) Bilateral 01/13/2012   Procedure: PERCUTANEOUS CORONARY STENT INTERVENTION (PCI-S);  Surgeon: Jettie Booze, MD;  Location: [facility] CATH LAB;  Service: Cardiovascular;  Laterality: Bilateral;  possible radial artery  . TONSILLECTOMY    . V-TACH ABLATION N/A 08/11/2012   Procedure: V-TACH ABLATION;   Surgeon: Thompson Grayer, MD;  Location: [facility] CATH LAB;  Service: Cardiovascular;  Laterality: N/A;  . VASECTOMY     Social History   Socioeconomic History  . Marital status: Married    Spouse name: Not on file  . Number of children: Not on file  . Years of education: Not on file  . Highest education level: Not on file  Occupational History  . Occupation: retired  Scientific laboratory technician  . Financial resource strain: Not on file  . Food insecurity:    Worry: Not on file    Inability: Not on file  . Transportation needs:    Medical: Not on file    Non-medical: Not on file  Tobacco Use  . Smoking status: Never Smoker  . Smokeless tobacco: Never Used  Substance and Sexual Activity  . Alcohol use: Yes    Comment: occasional  . Drug use: No  . Sexual activity: Not Currently  Lifestyle  . Physical activity:    Days per week: Not on file    Minutes per session: Not on file  . Stress: Not on file  Relationships  . Social connections:    Talks on phone: Not on file    Gets together: Not on file    Attends religious service: Not on file    Active member of club or organization: Not on file    Attends meetings of clubs or organizations: Not on file    Relationship status: Not on file  . Intimate partner violence:    Fear of current or ex partner: Not on file    Emotionally abused: Not on file    Physically abused: Not on file    Forced sexual activity: Not on file  Other Topics Concern  . Not on file  Social History Narrative  . Not on file     Lipid Panel     Component Value Date/Time   CHOL 137 10/16/2014 0740   TRIG 46.0 10/16/2014 0740   HDL 60.90 10/16/2014 0740   CHOLHDL 2 10/16/2014 0740   VLDL 9.2 10/16/2014 0740   LDLCALC 67 10/16/2014 0740    Review of Systems: General: negative for chills, fever, night sweats or weight changes.  Cardiovascular: negative for chest pain, dyspnea on exertion, edema, orthopnea, palpitations, paroxysmal nocturnal dyspnea or shortness of  breath Dermatological: negative for rash Respiratory: negative for cough or wheezing Urologic: negative for hematuria Abdominal: negative for nausea, vomiting, diarrhea, bright red blood per rectum, melena, or hematemesis Neurologic: negative for visual changes, syncope, or dizziness All other systems reviewed and are otherwise negative except as noted above.   Physical Exam:  Blood pressure 120/62, pulse 70, height 5' 5.5" (1.664 m), weight 132 lb  6.4 oz (60.1 kg), SpO2 97 %.  General appearance: alert, cooperative and no distress Neck: no carotid bruit and no JVD Lungs: clear to auscultation bilaterally Heart: regular rate and rhythm, S1, S2 normal, no murmur, click, rub or gallop Extremities: extremities normal, atraumatic, no cyanosis or edema Pulses: 2+ and symmetric Skin: Skin color, texture, turgor normal. No rashes or lesions Neurologic: Grossly normal  EKG known right bundle branch block.  Sinus bradycardia.  56 bpm.-- personally reviewed   ASSESSMENT AND PLAN:   1.  CAD: Status post PCI of the LAD in 2013.  He had a low risk nuclear stress test March 2019 that showed no ischemia.  He denies any anginal symptomatology.  No chest pain or dyspnea.  Continue aspirin and statin.  He is not on a beta-blocker due to baseline sinus bradycardia.  2.  Ischemic dilated cardiomyopathy: Most recent echocardiogram 01/2018 showed mild to moderately reduced EF at 40 to 45%.  This is consistent with prior echocardiograms.  Also with grade 2 diastolic dysfunction.  Blood pressure and heart rate both well controlled.  He is on ACE inhibitor therapy with ramipril 5 mg daily.  He is not on a beta-blocker due to baseline sinus bradycardia.  He is euvolemic on physical exam.  Denies dyspnea.  Exercise tolerance is good.  Exercises daily.  No limitations.  3.  RVOT PVCs and VT: Status post ablation by Dr. Rayann Heman.  Stable.  Denies any palpitations.  EKG shows known right bundle branch block and sinus  bradycardia heart rate 56 bpm. No PVCs. He is not on any AV nodal blocking agents due to baseline sinus bradycardia.  4.  Dyslipidemia: On statin therapy with simvastatin 40 mg daily. He brings to clinic today a copy of his last lipid panel done by his PCP Dr. Drema Dallas at [facility].  Lipid panel was done September 20, 2018.  LDL was 75 mg/dL.  HDL was 66.  Triglycerides 81.  Continue simvastatin.   5.  Right bundle branch block: First noted during last office visit with Dr. Radford Pax in 2019.  Patient has been asymptomatic. He had a stress test March 2019 at time of diagnosis that was negative for ischemia.  Echo showed mild to moderately reduced left ventricular EF, similar to prior echocardiograms.  Remains asymptomatic.   Follow-Up with Dr. Radford Pax in 1 year.  Lamara Brecht Ladoris Gene, MHS [facility] 02/07/2019 8:13 AM

## 2019-06-16 DIAGNOSIS — Z79899 Other long term (current) drug therapy: Secondary | ICD-10-CM | POA: Diagnosis not present

## 2019-06-16 DIAGNOSIS — R7309 Other abnormal glucose: Secondary | ICD-10-CM | POA: Diagnosis not present

## 2019-06-16 DIAGNOSIS — E7800 Pure hypercholesterolemia, unspecified: Secondary | ICD-10-CM | POA: Diagnosis not present

## 2019-06-16 DIAGNOSIS — E230 Hypopituitarism: Secondary | ICD-10-CM | POA: Diagnosis not present

## 2019-06-16 DIAGNOSIS — E559 Vitamin D deficiency, unspecified: Secondary | ICD-10-CM | POA: Diagnosis not present

## 2019-06-20 DIAGNOSIS — L814 Other melanin hyperpigmentation: Secondary | ICD-10-CM | POA: Diagnosis not present

## 2019-06-20 DIAGNOSIS — Z125 Encounter for screening for malignant neoplasm of prostate: Secondary | ICD-10-CM | POA: Diagnosis not present

## 2019-06-20 DIAGNOSIS — R7309 Other abnormal glucose: Secondary | ICD-10-CM | POA: Diagnosis not present

## 2019-06-20 DIAGNOSIS — D229 Melanocytic nevi, unspecified: Secondary | ICD-10-CM | POA: Diagnosis not present

## 2019-06-20 DIAGNOSIS — E559 Vitamin D deficiency, unspecified: Secondary | ICD-10-CM | POA: Diagnosis not present

## 2019-06-20 DIAGNOSIS — E230 Hypopituitarism: Secondary | ICD-10-CM | POA: Diagnosis not present

## 2019-06-20 DIAGNOSIS — L821 Other seborrheic keratosis: Secondary | ICD-10-CM | POA: Diagnosis not present

## 2019-06-20 DIAGNOSIS — Z5181 Encounter for therapeutic drug level monitoring: Secondary | ICD-10-CM | POA: Diagnosis not present

## 2019-06-20 DIAGNOSIS — M8580 Other specified disorders of bone density and structure, unspecified site: Secondary | ICD-10-CM | POA: Diagnosis not present

## 2019-06-20 DIAGNOSIS — L819 Disorder of pigmentation, unspecified: Secondary | ICD-10-CM | POA: Diagnosis not present

## 2019-06-27 DIAGNOSIS — E7800 Pure hypercholesterolemia, unspecified: Secondary | ICD-10-CM | POA: Diagnosis not present

## 2019-06-27 DIAGNOSIS — E871 Hypo-osmolality and hyponatremia: Secondary | ICD-10-CM | POA: Diagnosis not present

## 2019-06-27 DIAGNOSIS — R748 Abnormal levels of other serum enzymes: Secondary | ICD-10-CM | POA: Diagnosis not present

## 2019-06-27 DIAGNOSIS — R7309 Other abnormal glucose: Secondary | ICD-10-CM | POA: Diagnosis not present

## 2019-06-27 DIAGNOSIS — E559 Vitamin D deficiency, unspecified: Secondary | ICD-10-CM | POA: Diagnosis not present

## 2019-06-27 DIAGNOSIS — F339 Major depressive disorder, recurrent, unspecified: Secondary | ICD-10-CM | POA: Diagnosis not present

## 2019-06-27 DIAGNOSIS — F411 Generalized anxiety disorder: Secondary | ICD-10-CM | POA: Diagnosis not present

## 2019-09-28 DIAGNOSIS — F411 Generalized anxiety disorder: Secondary | ICD-10-CM | POA: Diagnosis not present

## 2019-09-28 DIAGNOSIS — F339 Major depressive disorder, recurrent, unspecified: Secondary | ICD-10-CM | POA: Diagnosis not present

## 2019-09-28 DIAGNOSIS — E7800 Pure hypercholesterolemia, unspecified: Secondary | ICD-10-CM | POA: Diagnosis not present

## 2019-09-28 DIAGNOSIS — E559 Vitamin D deficiency, unspecified: Secondary | ICD-10-CM | POA: Diagnosis not present

## 2019-09-28 DIAGNOSIS — R748 Abnormal levels of other serum enzymes: Secondary | ICD-10-CM | POA: Diagnosis not present

## 2019-09-28 DIAGNOSIS — E871 Hypo-osmolality and hyponatremia: Secondary | ICD-10-CM | POA: Diagnosis not present

## 2019-09-28 DIAGNOSIS — R7309 Other abnormal glucose: Secondary | ICD-10-CM | POA: Diagnosis not present

## 2019-10-06 DIAGNOSIS — R7303 Prediabetes: Secondary | ICD-10-CM | POA: Diagnosis not present

## 2019-10-06 DIAGNOSIS — Z0000 Encounter for general adult medical examination without abnormal findings: Secondary | ICD-10-CM | POA: Diagnosis not present

## 2019-10-06 DIAGNOSIS — E7800 Pure hypercholesterolemia, unspecified: Secondary | ICD-10-CM | POA: Diagnosis not present

## 2019-10-06 DIAGNOSIS — E230 Hypopituitarism: Secondary | ICD-10-CM | POA: Diagnosis not present

## 2019-10-06 DIAGNOSIS — E559 Vitamin D deficiency, unspecified: Secondary | ICD-10-CM | POA: Diagnosis not present

## 2019-10-06 DIAGNOSIS — F339 Major depressive disorder, recurrent, unspecified: Secondary | ICD-10-CM | POA: Diagnosis not present

## 2019-10-06 DIAGNOSIS — Z8679 Personal history of other diseases of the circulatory system: Secondary | ICD-10-CM | POA: Diagnosis not present

## 2019-10-06 DIAGNOSIS — F411 Generalized anxiety disorder: Secondary | ICD-10-CM | POA: Diagnosis not present

## 2019-10-06 DIAGNOSIS — I2510 Atherosclerotic heart disease of native coronary artery without angina pectoris: Secondary | ICD-10-CM | POA: Diagnosis not present

## 2019-10-23 DIAGNOSIS — Z20828 Contact with and (suspected) exposure to other viral communicable diseases: Secondary | ICD-10-CM | POA: Diagnosis not present

## 2019-11-06 DIAGNOSIS — Z20828 Contact with and (suspected) exposure to other viral communicable diseases: Secondary | ICD-10-CM | POA: Diagnosis not present

## 2019-12-21 DIAGNOSIS — R7309 Other abnormal glucose: Secondary | ICD-10-CM | POA: Diagnosis not present

## 2019-12-21 DIAGNOSIS — Z79899 Other long term (current) drug therapy: Secondary | ICD-10-CM | POA: Diagnosis not present

## 2019-12-21 DIAGNOSIS — E230 Hypopituitarism: Secondary | ICD-10-CM | POA: Diagnosis not present

## 2019-12-21 DIAGNOSIS — E559 Vitamin D deficiency, unspecified: Secondary | ICD-10-CM | POA: Diagnosis not present

## 2019-12-21 DIAGNOSIS — Z125 Encounter for screening for malignant neoplasm of prostate: Secondary | ICD-10-CM | POA: Diagnosis not present

## 2019-12-21 DIAGNOSIS — M8580 Other specified disorders of bone density and structure, unspecified site: Secondary | ICD-10-CM | POA: Diagnosis not present

## 2020-01-30 DIAGNOSIS — M8580 Other specified disorders of bone density and structure, unspecified site: Secondary | ICD-10-CM | POA: Diagnosis not present

## 2020-01-30 DIAGNOSIS — E230 Hypopituitarism: Secondary | ICD-10-CM | POA: Diagnosis not present

## 2020-01-30 DIAGNOSIS — Z79899 Other long term (current) drug therapy: Secondary | ICD-10-CM | POA: Diagnosis not present

## 2020-01-30 DIAGNOSIS — R7309 Other abnormal glucose: Secondary | ICD-10-CM | POA: Diagnosis not present

## 2020-01-30 DIAGNOSIS — N3941 Urge incontinence: Secondary | ICD-10-CM | POA: Diagnosis not present

## 2020-02-12 DIAGNOSIS — R350 Frequency of micturition: Secondary | ICD-10-CM | POA: Diagnosis not present

## 2020-02-12 DIAGNOSIS — N400 Enlarged prostate without lower urinary tract symptoms: Secondary | ICD-10-CM | POA: Diagnosis not present

## 2020-02-12 DIAGNOSIS — B351 Tinea unguium: Secondary | ICD-10-CM | POA: Diagnosis not present

## 2020-03-25 DIAGNOSIS — N5201 Erectile dysfunction due to arterial insufficiency: Secondary | ICD-10-CM | POA: Diagnosis not present

## 2020-03-25 DIAGNOSIS — N401 Enlarged prostate with lower urinary tract symptoms: Secondary | ICD-10-CM | POA: Diagnosis not present

## 2020-03-25 DIAGNOSIS — R351 Nocturia: Secondary | ICD-10-CM | POA: Diagnosis not present

## 2020-03-25 DIAGNOSIS — N3941 Urge incontinence: Secondary | ICD-10-CM | POA: Diagnosis not present

## 2020-03-25 DIAGNOSIS — R3915 Urgency of urination: Secondary | ICD-10-CM | POA: Diagnosis not present

## 2020-04-22 DIAGNOSIS — R3915 Urgency of urination: Secondary | ICD-10-CM | POA: Diagnosis not present

## 2020-04-22 DIAGNOSIS — N401 Enlarged prostate with lower urinary tract symptoms: Secondary | ICD-10-CM | POA: Diagnosis not present

## 2020-04-22 DIAGNOSIS — N3941 Urge incontinence: Secondary | ICD-10-CM | POA: Diagnosis not present

## 2020-04-22 DIAGNOSIS — R351 Nocturia: Secondary | ICD-10-CM | POA: Diagnosis not present

## 2020-04-25 DIAGNOSIS — E7800 Pure hypercholesterolemia, unspecified: Secondary | ICD-10-CM | POA: Diagnosis not present

## 2020-04-25 DIAGNOSIS — I2510 Atherosclerotic heart disease of native coronary artery without angina pectoris: Secondary | ICD-10-CM | POA: Diagnosis not present

## 2020-04-25 DIAGNOSIS — N400 Enlarged prostate without lower urinary tract symptoms: Secondary | ICD-10-CM | POA: Diagnosis not present

## 2020-04-25 DIAGNOSIS — F339 Major depressive disorder, recurrent, unspecified: Secondary | ICD-10-CM | POA: Diagnosis not present

## 2020-06-20 DIAGNOSIS — R7303 Prediabetes: Secondary | ICD-10-CM | POA: Diagnosis not present

## 2020-06-20 DIAGNOSIS — E559 Vitamin D deficiency, unspecified: Secondary | ICD-10-CM | POA: Diagnosis not present

## 2020-06-20 DIAGNOSIS — F411 Generalized anxiety disorder: Secondary | ICD-10-CM | POA: Diagnosis not present

## 2020-06-20 DIAGNOSIS — F339 Major depressive disorder, recurrent, unspecified: Secondary | ICD-10-CM | POA: Diagnosis not present

## 2020-06-20 DIAGNOSIS — E7800 Pure hypercholesterolemia, unspecified: Secondary | ICD-10-CM | POA: Diagnosis not present

## 2020-06-27 ENCOUNTER — Encounter (HOSPITAL_COMMUNITY): Payer: Self-pay

## 2020-06-27 ENCOUNTER — Other Ambulatory Visit: Payer: Self-pay

## 2020-06-27 DIAGNOSIS — W010XXA Fall on same level from slipping, tripping and stumbling without subsequent striking against object, initial encounter: Secondary | ICD-10-CM | POA: Diagnosis not present

## 2020-06-27 DIAGNOSIS — Y939 Activity, unspecified: Secondary | ICD-10-CM | POA: Insufficient documentation

## 2020-06-27 DIAGNOSIS — S299XXA Unspecified injury of thorax, initial encounter: Secondary | ICD-10-CM | POA: Diagnosis not present

## 2020-06-27 DIAGNOSIS — Z20822 Contact with and (suspected) exposure to covid-19: Secondary | ICD-10-CM | POA: Diagnosis not present

## 2020-06-27 DIAGNOSIS — R55 Syncope and collapse: Secondary | ICD-10-CM | POA: Diagnosis not present

## 2020-06-27 DIAGNOSIS — S79912A Unspecified injury of left hip, initial encounter: Secondary | ICD-10-CM | POA: Diagnosis not present

## 2020-06-27 DIAGNOSIS — R079 Chest pain, unspecified: Secondary | ICD-10-CM | POA: Diagnosis not present

## 2020-06-27 DIAGNOSIS — Y999 Unspecified external cause status: Secondary | ICD-10-CM | POA: Insufficient documentation

## 2020-06-27 DIAGNOSIS — M25512 Pain in left shoulder: Secondary | ICD-10-CM | POA: Diagnosis not present

## 2020-06-27 DIAGNOSIS — S4992XA Unspecified injury of left shoulder and upper arm, initial encounter: Secondary | ICD-10-CM | POA: Diagnosis not present

## 2020-06-27 DIAGNOSIS — M25552 Pain in left hip: Secondary | ICD-10-CM | POA: Diagnosis not present

## 2020-06-27 DIAGNOSIS — R001 Bradycardia, unspecified: Secondary | ICD-10-CM | POA: Diagnosis not present

## 2020-06-27 DIAGNOSIS — E871 Hypo-osmolality and hyponatremia: Secondary | ICD-10-CM | POA: Insufficient documentation

## 2020-06-27 DIAGNOSIS — S0101XA Laceration without foreign body of scalp, initial encounter: Secondary | ICD-10-CM | POA: Diagnosis not present

## 2020-06-27 DIAGNOSIS — Z79899 Other long term (current) drug therapy: Secondary | ICD-10-CM | POA: Diagnosis not present

## 2020-06-27 DIAGNOSIS — S199XXA Unspecified injury of neck, initial encounter: Secondary | ICD-10-CM | POA: Diagnosis not present

## 2020-06-27 DIAGNOSIS — Y929 Unspecified place or not applicable: Secondary | ICD-10-CM | POA: Insufficient documentation

## 2020-06-27 DIAGNOSIS — Z7982 Long term (current) use of aspirin: Secondary | ICD-10-CM | POA: Diagnosis not present

## 2020-06-27 DIAGNOSIS — S0990XA Unspecified injury of head, initial encounter: Secondary | ICD-10-CM | POA: Diagnosis not present

## 2020-06-27 DIAGNOSIS — I2510 Atherosclerotic heart disease of native coronary artery without angina pectoris: Secondary | ICD-10-CM | POA: Insufficient documentation

## 2020-06-27 NOTE — ED Triage Notes (Signed)
Pt. Came in A&O x4 ambulatory with C/o of falling 2 times today and pt. has a small laceration behind the ear. Denies LOC both times.

## 2020-06-28 ENCOUNTER — Observation Stay (HOSPITAL_COMMUNITY)
Admission: EM | Admit: 2020-06-28 | Discharge: 2020-06-28 | Disposition: A | Discharge status: Home / Self Care | Payer: Medicare Other | Attending: Internal Medicine | Admitting: Internal Medicine

## 2020-06-28 ENCOUNTER — Emergency Department (HOSPITAL_COMMUNITY): Payer: Medicare Other

## 2020-06-28 DIAGNOSIS — S299XXA Unspecified injury of thorax, initial encounter: Secondary | ICD-10-CM | POA: Diagnosis not present

## 2020-06-28 DIAGNOSIS — S0101XA Laceration without foreign body of scalp, initial encounter: Secondary | ICD-10-CM

## 2020-06-28 DIAGNOSIS — M25512 Pain in left shoulder: Secondary | ICD-10-CM | POA: Diagnosis not present

## 2020-06-28 DIAGNOSIS — R001 Bradycardia, unspecified: Secondary | ICD-10-CM | POA: Diagnosis not present

## 2020-06-28 DIAGNOSIS — R55 Syncope and collapse: Secondary | ICD-10-CM | POA: Diagnosis not present

## 2020-06-28 DIAGNOSIS — W19XXXA Unspecified fall, initial encounter: Secondary | ICD-10-CM

## 2020-06-28 DIAGNOSIS — M25552 Pain in left hip: Secondary | ICD-10-CM | POA: Diagnosis not present

## 2020-06-28 DIAGNOSIS — R079 Chest pain, unspecified: Secondary | ICD-10-CM | POA: Diagnosis not present

## 2020-06-28 DIAGNOSIS — S199XXA Unspecified injury of neck, initial encounter: Secondary | ICD-10-CM | POA: Diagnosis not present

## 2020-06-28 DIAGNOSIS — I2510 Atherosclerotic heart disease of native coronary artery without angina pectoris: Secondary | ICD-10-CM | POA: Diagnosis present

## 2020-06-28 DIAGNOSIS — S4992XA Unspecified injury of left shoulder and upper arm, initial encounter: Secondary | ICD-10-CM | POA: Diagnosis not present

## 2020-06-28 DIAGNOSIS — R338 Other retention of urine: Secondary | ICD-10-CM

## 2020-06-28 DIAGNOSIS — E871 Hypo-osmolality and hyponatremia: Secondary | ICD-10-CM

## 2020-06-28 DIAGNOSIS — S79912A Unspecified injury of left hip, initial encounter: Secondary | ICD-10-CM | POA: Diagnosis not present

## 2020-06-28 DIAGNOSIS — S0990XA Unspecified injury of head, initial encounter: Secondary | ICD-10-CM | POA: Diagnosis not present

## 2020-06-28 LAB — CBC WITH DIFFERENTIAL/PLATELET
Abs Immature Granulocytes: 0.07 10*3/uL (ref 0.00–0.07)
Basophils Absolute: 0 10*3/uL (ref 0.0–0.1)
Basophils Relative: 0 %
Eosinophils Absolute: 0.1 10*3/uL (ref 0.0–0.5)
Eosinophils Relative: 1 %
HCT: 37.4 % — ABNORMAL LOW (ref 39.0–52.0)
Hemoglobin: 12 g/dL — ABNORMAL LOW (ref 13.0–17.0)
Immature Granulocytes: 1 %
Lymphocytes Relative: 7 %
Lymphs Abs: 0.8 10*3/uL (ref 0.7–4.0)
MCH: 31.9 pg (ref 26.0–34.0)
MCHC: 32.1 g/dL (ref 30.0–36.0)
MCV: 99.5 fL (ref 80.0–100.0)
Monocytes Absolute: 0.8 10*3/uL (ref 0.1–1.0)
Monocytes Relative: 8 %
Neutro Abs: 8.7 10*3/uL — ABNORMAL HIGH (ref 1.7–7.7)
Neutrophils Relative %: 83 %
Platelets: 206 10*3/uL (ref 150–400)
RBC: 3.76 MIL/uL — ABNORMAL LOW (ref 4.22–5.81)
RDW: 13.1 % (ref 11.5–15.5)
WBC: 10.5 10*3/uL (ref 4.0–10.5)
nRBC: 0 % (ref 0.0–0.2)

## 2020-06-28 LAB — OSMOLALITY, URINE: Osmolality, Ur: 203 mOsm/kg — ABNORMAL LOW (ref 300–900)

## 2020-06-28 LAB — BASIC METABOLIC PANEL
Anion gap: 8 (ref 5–15)
Anion gap: 7 (ref 5–15)
Anion gap: 10 (ref 5–15)
Anion gap: 12 (ref 5–15)
BUN: 13 mg/dL (ref 8–23)
BUN: 11 mg/dL (ref 8–23)
BUN: 12 mg/dL (ref 8–23)
BUN: 12 mg/dL (ref 8–23)
CO2: 23 mmol/L (ref 22–32)
CO2: 22 mmol/L (ref 22–32)
CO2: 25 mmol/L (ref 22–32)
CO2: 24 mmol/L (ref 22–32)
Calcium: 8.6 mg/dL — ABNORMAL LOW (ref 8.9–10.3)
Calcium: 7.3 mg/dL — ABNORMAL LOW (ref 8.9–10.3)
Calcium: 8.3 mg/dL — ABNORMAL LOW (ref 8.9–10.3)
Calcium: 8.4 mg/dL — ABNORMAL LOW (ref 8.9–10.3)
Chloride: 94 mmol/L — ABNORMAL LOW (ref 98–111)
Chloride: 99 mmol/L (ref 98–111)
Chloride: 96 mmol/L — ABNORMAL LOW (ref 98–111)
Chloride: 96 mmol/L — ABNORMAL LOW (ref 98–111)
Creatinine, Ser: 0.76 mg/dL (ref 0.61–1.24)
Creatinine, Ser: 0.57 mg/dL — ABNORMAL LOW (ref 0.61–1.24)
Creatinine, Ser: 0.76 mg/dL (ref 0.61–1.24)
Creatinine, Ser: 0.68 mg/dL (ref 0.61–1.24)
GFR calc Af Amer: >60 mL/min (ref >60)
GFR calc Af Amer: >60 mL/min (ref >60)
GFR calc Af Amer: >60 mL/min (ref >60)
GFR calc Af Amer: >60 mL/min (ref >60)
GFR calc non Af Amer: >60 mL/min (ref >60)
GFR calc non Af Amer: >60 mL/min (ref >60)
GFR calc non Af Amer: >60 mL/min (ref >60)
GFR calc non Af Amer: >60 mL/min (ref >60)
Glucose, Bld: 85 mg/dL (ref 70–99)
Glucose, Bld: 86 mg/dL (ref 70–99)
Glucose, Bld: 100 mg/dL — ABNORMAL HIGH (ref 70–99)
Glucose, Bld: 96 mg/dL (ref 70–99)
Potassium: 4.2 mmol/L (ref 3.5–5.1)
Potassium: 3.3 mmol/L — ABNORMAL LOW (ref 3.5–5.1)
Potassium: 3.9 mmol/L (ref 3.5–5.1)
Potassium: 4.2 mmol/L (ref 3.5–5.1)
Sodium: 125 mmol/L — ABNORMAL LOW (ref 135–145)
Sodium: 128 mmol/L — ABNORMAL LOW (ref 135–145)
Sodium: 131 mmol/L — ABNORMAL LOW (ref 135–145)
Sodium: 132 mmol/L — ABNORMAL LOW (ref 135–145)

## 2020-06-28 LAB — TROPONIN I (HIGH SENSITIVITY)
Troponin I (High Sensitivity): 3 ng/L (ref <18)
Troponin I (High Sensitivity): 3 ng/L (ref <18)

## 2020-06-28 LAB — SODIUM, URINE, RANDOM: Sodium, Ur: 33 mmol/L

## 2020-06-28 LAB — SARS CORONAVIRUS 2 BY RT PCR (HOSPITAL ORDER, PERFORMED IN ~~LOC~~ HOSPITAL LAB): SARS Coronavirus 2: NEGATIVE

## 2020-06-28 LAB — OSMOLALITY: Osmolality: 272 mOsm/kg — ABNORMAL LOW (ref 275–295)

## 2020-06-28 LAB — HIV ANTIBODY (ROUTINE TESTING W REFLEX): HIV Screen 4th Generation wRfx: NONREACTIVE

## 2020-06-28 MED ORDER — SODIUM CHLORIDE 0.9 % IV SOLN: INTRAVENOUS | Status: DC

## 2020-06-28 MED ORDER — ENOXAPARIN SODIUM 40 MG/0.4ML ~~LOC~~ SOLN
40.0000 mg | SUBCUTANEOUS | Status: DC
  Administered 2020-06-28: 40 mg via SUBCUTANEOUS
  Filled 2020-06-28: qty 0.4

## 2020-06-28 MED ORDER — FINASTERIDE 5 MG PO TABS
5.0000 mg | ORAL_TABLET | Freq: Every day | ORAL | Status: DC
  Administered 2020-06-28: 5 mg via ORAL
  Filled 2020-06-28: qty 1

## 2020-06-28 MED ORDER — RAMIPRIL 2.5 MG PO CAPS
2.5000 mg | ORAL_CAPSULE | Freq: Every day | ORAL | Status: DC
  Administered 2020-06-28: 2.5 mg via ORAL
  Filled 2020-06-28: qty 1

## 2020-06-28 MED ORDER — FINASTERIDE 5 MG PO TABS: 5.0000 mg | ORAL_TABLET | Freq: Every day | ORAL | 2 refills | Status: DC

## 2020-06-28 MED ORDER — ACETAMINOPHEN 325 MG PO TABS: 650.0000 mg | ORAL_TABLET | Freq: Four times a day (QID) | ORAL | Status: DC | PRN

## 2020-06-28 MED ORDER — ACETAMINOPHEN 650 MG RE SUPP: 650.0000 mg | Freq: Four times a day (QID) | RECTAL | Status: DC | PRN

## 2020-06-28 MED ORDER — ASPIRIN EC 81 MG PO TBEC
81.0000 mg | DELAYED_RELEASE_TABLET | Freq: Every day | ORAL | Status: DC
  Administered 2020-06-28: 81 mg via ORAL
  Filled 2020-06-28: qty 1

## 2020-06-28 MED ORDER — SERTRALINE HCL 50 MG PO TABS: 75.0000 mg | ORAL_TABLET | Freq: Every day | ORAL | Status: DC

## 2020-06-28 MED ORDER — SIMVASTATIN 20 MG PO TABS
40.0000 mg | ORAL_TABLET | Freq: Every day | ORAL | Status: DC
  Administered 2020-06-28: 40 mg via ORAL
  Filled 2020-06-28: qty 2

## 2020-06-28 MED ORDER — LIDOCAINE-EPINEPHRINE (PF) 2 %-1:200000 IJ SOLN
20.0000 mL | Freq: Once | INTRAMUSCULAR | Status: AC
  Administered 2020-06-28: 20 mL
  Filled 2020-06-28: qty 20

## 2020-06-28 MED ORDER — TESTOSTERONE 10 MG/ACT (2%) TD GEL: 4.0000 | Freq: Every day | TRANSDERMAL | Status: DC

## 2020-06-28 MED ORDER — TAMSULOSIN HCL 0.4 MG PO CAPS: 0.4000 mg | ORAL_CAPSULE | Freq: Every day | ORAL | Status: DC

## 2020-06-28 NOTE — ED Notes (Signed)
Pt ambulatory to and from bathroom with steady gait

## 2020-06-28 NOTE — H&P (Signed)
History and Physical    Patrick Barnes KMQ:[id number] DOB: [birth year] DOA: 06/28/2020  PCP: Leighton Ruff, MD Patient coming from: Home  Chief Complaint: Fall, head injury  HPI: Patrick Barnes is a 69 y.o. male with medical history significant of anxiety, bradycardia, PVCs and VT status post ablation, CAD status post PCI in 2013, ischemic dilated cardiomyopathy (EF 40-45 %), hyperlipidemia, hypogonadotropic hypogonadism followed by endocrinology presenting to the ED for evaluation of head injury, left shoulder, and left hip pain after 2 falls yesterday.  Patient states first time he fell as he was walking to the kitchen and tripped on a vacuum cleaner.  The second time he was in the bathroom straining to urinate and then just remembers waking up.  He thinks he hit his head on the tub and fell on his left side.  Denies preceding dizziness, shortness of breath, or chest pain.  No other complaints.  Denies fevers, nausea, vomiting, abdominal pain, or diarrhea.  His appetite has been good.  ED Course: Afebrile.  Bradycardic with heart rate as low as 30s.  Not hypotensive.  Labs showing no significant leukocytosis.  Hemoglobin 12.0, at baseline.  Sodium 125.  High-sensitivity troponin negative.  SARS-CoV-2 PCR test pending.  Chest x-ray showing no acute abnormality.  X-ray of left shoulder negative for fracture or dislocation.  X-ray of left hip negative for fracture or dislocation.  Head CT negative for acute intracranial abnormality.  CT C-spine negative for acute fracture or malalignment of the spine.  Patient had 500 cc urine in his bladder and Foley was placed.  Small laceration behind the left ear repaired in the ED.  Review of Systems:  All systems reviewed and apart from history of presenting illness, are negative.  Past Medical History:  Diagnosis Date  . Anxiety   . Bradycardia 07/23/2016  . CAD (coronary artery disease) 01/2012   cardiac cath...with 80% stenosis in proximal LAD  with PCI     . Dilated cardiomyopathy ([place])    Echo 11/2012 EF 45-50%  . Dyslipidemia   . Elevated CPK    Proboly due to excessive exercise.  . Elevated LFTs    history of-Now resolved  . H/O vasectomy   . Hx of tonsillectomy   . Hypogonadotropic hypogonadism (HCC)    Dr. Buddy Duty and Dr Jeffie Pollock  . Inguinal hernia   . Low bone density for age    endo is following  . Low testosterone   . PVC's (premature ventricular contractions)    s/p ablation Dr Rayann Heman and Dr Radford Pax  . RBBB   . Vitamin D deficiency     Past Surgical History:  Procedure Laterality Date  . CARDIAC CATHETERIZATION     w 80% stenosis in proimal LAD w PCI on 2/13.  [name] Kitchen COLONOSCOPY  10/2005   had internal nonbleeding small hemorrhoids,repeat in 10 years.  . CYSTECTOMY    . EP study and ablation for RVOT PVCs  08/12/12  . HERNIA REPAIR     Inguinal  . PERCUTANEOUS CORONARY STENT INTERVENTION (PCI-S) Bilateral 01/13/2012   Procedure: PERCUTANEOUS CORONARY STENT INTERVENTION (PCI-S);  Surgeon: Jettie Booze, MD;  Location: [facility] CATH LAB;  Service: Cardiovascular;  Laterality: Bilateral;  possible radial artery  . TONSILLECTOMY    . V-TACH ABLATION N/A 08/11/2012   Procedure: V-TACH ABLATION;  Surgeon: Thompson Grayer, MD;  Location: [facility] CATH LAB;  Service: Cardiovascular;  Laterality: N/A;  . VASECTOMY       reports that he has never smoked. He has  never used smokeless tobacco. He reports current alcohol use. He reports that he does not use drugs.  Allergies  Allergen Reactions  . Quinolones     Prolonged QT    Family History  Problem Relation Age of Onset  . Cancer Mother        died at 98yo    Prior to Admission medications   Medication Sig Start Date End Date Taking? Authorizing Provider  acetaminophen (TYLENOL) 500 MG tablet Take 1,000 mg by mouth daily as needed. For sinus headaches   Yes [provider]  BABY ASPIRIN PO Take 81 mg by mouth daily.   Yes [provider]  cholecalciferol  (VITAMIN D3) 25 MCG (1000 UT) tablet Take 1,000 Units by mouth daily.   Yes [provider]  DENTA 5000 PLUS 1.1 % CREA dental cream Place 1 application onto teeth at bedtime.  12/06/13  Yes [provider]  Melatonin 3 MG CAPS Take 3 mg by mouth daily as needed (sleep).    Yes [provider]  MULTIPLE VITAMIN PO Take 1 tablet by mouth every other day.    Yes [provider]  nitroGLYCERIN (NITROSTAT) 0.4 MG SL tablet PLACE 1 TABLET UNDER TONGUE EVERY 5 MINUTES AS NEEDED FOR CHEST PAIN.[name] Kitchen MAXIMUM OF 3 DOSES 05/23/18  Yes Turner, Traci R, MD  ramipril (ALTACE) 2.5 MG capsule Take 2.5 mg by mouth daily.  12/09/18  Yes [provider]  sertraline (ZOLOFT) 50 MG tablet Take 75 mg by mouth daily.    Yes [provider]  simvastatin (ZOCOR) 40 MG tablet Take 40 mg by mouth daily.   Yes [provider]  tamsulosin (FLOMAX) 0.4 MG CAPS capsule Take 0.4 mg by mouth daily. 06/19/20  Yes [provider]  Testosterone (FORTESTA) 10 MG/ACT (2%) GEL Place 4 Squirts onto the skin daily.    Yes [provider]    Physical Exam: Vitals:   06/28/20 0301 06/28/20 0307 06/28/20 0330 06/28/20 0430  BP: 108/75  (!) 130/75 127/76  Pulse:  (!) 39 (!) 44 47  Resp: 14 (!) 10 16 13   Temp:      TempSrc:      SpO2:  100% 98% 99%  Weight:      Height:        Physical Exam Constitutional:      General: He is not in acute distress. HENT:     Head: Normocephalic.     Comments: Small laceration behind left ear with staple in place    Mouth/Throat:     Mouth: Mucous membranes are moist.     Pharynx: Oropharynx is clear.  Eyes:     Extraocular Movements: Extraocular movements intact.     Conjunctiva/sclera: Conjunctivae normal.  Cardiovascular:     Rate and Rhythm: Normal rate and regular rhythm.     Pulses: Normal pulses.  Pulmonary:     Effort: Pulmonary effort is normal. No respiratory distress.     Breath sounds: Normal breath  sounds. No wheezing or rales.  Abdominal:     General: Bowel sounds are normal. There is no distension.     Palpations: Abdomen is soft.     Tenderness: There is no abdominal tenderness. There is no guarding.  Musculoskeletal:        General: No swelling or tenderness.     Cervical back: Normal range of motion and neck supple.  Skin:    General: Skin is warm and dry.  Neurological:  General: No focal deficit present.     Mental Status: He is alert and oriented to person, place, and time.     Labs on Admission: I have personally reviewed following labs and imaging studies  CBC: Recent Labs  Lab 06/28/20 0123  WBC 10.5  NEUTROABS 8.7*  HGB 12.0*  HCT 37.4*  MCV 99.5  PLT 144   Basic Metabolic Panel: Recent Labs  Lab 06/28/20 0123  NA 125*  K 4.2  CL 94*  CO2 23  GLUCOSE 85  BUN 13  CREATININE 0.76  CALCIUM 8.6*   GFR: Estimated Creatinine Clearance: 68.9 mL/min (by C-G formula based on SCr of 0.76 mg/dL). Liver Function Tests: No results for input(s): AST, ALT, ALKPHOS, BILITOT, PROT, ALBUMIN in the last 168 hours. No results for input(s): LIPASE, AMYLASE in the last 168 hours. No results for input(s): AMMONIA in the last 168 hours. Coagulation Profile: No results for input(s): INR, PROTIME in the last 168 hours. Cardiac Enzymes: No results for input(s): CKTOTAL, CKMB, CKMBINDEX, TROPONINI in the last 168 hours. BNP (last 3 results) No results for input(s): PROBNP in the last 8760 hours. HbA1C: No results for input(s): HGBA1C in the last 72 hours. CBG: No results for input(s): GLUCAP in the last 168 hours. Lipid Profile: No results for input(s): CHOL, HDL, LDLCALC, TRIG, CHOLHDL, LDLDIRECT in the last 72 hours. Thyroid Function Tests: No results for input(s): TSH, T4TOTAL, FREET4, T3FREE, THYROIDAB in the last 72 hours. Anemia Panel: No results for input(s): VITAMINB12, FOLATE, FERRITIN, TIBC, IRON, RETICCTPCT in the last 72 hours. Urine analysis: No  results found for: COLORURINE, APPEARANCEUR, [place], [place], GLUCOSEU, HGBUR, BILIRUBINUR, KETONESUR, PROTEINUR, UROBILINOGEN, NITRITE, LEUKOCYTESUR  Radiological Exams on Admission: DG Chest 2 View  Result Date: 06/28/2020 CLINICAL DATA:  Recent fall with chest pain, initial encounter EXAM: CHEST - 2 VIEW COMPARISON:  02/25/2018 FINDINGS: Cardiac shadow is within normal limits. The lungs are well aerated bilaterally. Some scarring is noted in the upper lobes bilaterally. No sizable effusion is seen. No acute bony abnormality is noted. IMPRESSION: No acute abnormality noted. Electronically Signed   By: Inez Catalina M.D.   On: 06/28/2020 02:12   CT Head Wo Contrast  Result Date: 06/28/2020 CLINICAL DATA:  Fall, laceration the high left ear EXAM: CT HEAD WITHOUT CONTRAST TECHNIQUE: Contiguous axial images were obtained from the base of the skull through the vertex without intravenous contrast. COMPARISON:  None. FINDINGS: Brain: No evidence of acute territorial infarction, hemorrhage, hydrocephalus,extra-axial collection or mass lesion/mass effect. There is dilatation the ventricles and sulci consistent with age-related atrophy. Low-attenuation changes in the deep white matter consistent with small vessel ischemia. Vascular: No hyperdense vessel or unexpected calcification. Skull: The skull is intact. No fracture or focal lesion identified. Sinuses/Orbits: The visualized paranasal sinuses and mastoid air cells are clear. The orbits and globes intact. Other: None Cervical spine: Alignment: Physiologic Skull base and vertebrae: Visualized skull base is intact. No atlanto-occipital dissociation. The vertebral body heights are well maintained. No fracture or pathologic osseous lesion seen. Soft tissues and spinal canal: The visualized paraspinal soft tissues are unremarkable. No prevertebral soft tissue swelling is seen. The spinal canal is grossly unremarkable, no large epidural collection or significant canal  narrowing. Disc levels: Cervical spine spondylosis is seen with disc height loss, disc osteophyte complex and uncovertebral osteophytes most notable at C4-C5 with moderate neural foraminal narrowing and mild central canal stenosis. Upper chest: Biapical scarring is noted. Thoracic inlet is within normal limits. Other: None IMPRESSION:  No acute intracranial abnormality. Findings consistent with age related atrophy and chronic small vessel ischemia No acute fracture or malalignment of the spine. Electronically Signed   By: Prudencio Pair M.D.   On: 06/28/2020 02:15   CT Cervical Spine Wo Contrast  Result Date: 06/28/2020 CLINICAL DATA:  Fall, laceration the high left ear EXAM: CT HEAD WITHOUT CONTRAST TECHNIQUE: Contiguous axial images were obtained from the base of the skull through the vertex without intravenous contrast. COMPARISON:  None. FINDINGS: Brain: No evidence of acute territorial infarction, hemorrhage, hydrocephalus,extra-axial collection or mass lesion/mass effect. There is dilatation the ventricles and sulci consistent with age-related atrophy. Low-attenuation changes in the deep white matter consistent with small vessel ischemia. Vascular: No hyperdense vessel or unexpected calcification. Skull: The skull is intact. No fracture or focal lesion identified. Sinuses/Orbits: The visualized paranasal sinuses and mastoid air cells are clear. The orbits and globes intact. Other: None Cervical spine: Alignment: Physiologic Skull base and vertebrae: Visualized skull base is intact. No atlanto-occipital dissociation. The vertebral body heights are well maintained. No fracture or pathologic osseous lesion seen. Soft tissues and spinal canal: The visualized paraspinal soft tissues are unremarkable. No prevertebral soft tissue swelling is seen. The spinal canal is grossly unremarkable, no large epidural collection or significant canal narrowing. Disc levels: Cervical spine spondylosis is seen with disc height  loss, disc osteophyte complex and uncovertebral osteophytes most notable at C4-C5 with moderate neural foraminal narrowing and mild central canal stenosis. Upper chest: Biapical scarring is noted. Thoracic inlet is within normal limits. Other: None IMPRESSION: No acute intracranial abnormality. Findings consistent with age related atrophy and chronic small vessel ischemia No acute fracture or malalignment of the spine. Electronically Signed   By: Prudencio Pair M.D.   On: 06/28/2020 02:15   DG Shoulder Left  Result Date: 06/28/2020 CLINICAL DATA:  Recent fall in bathtub with left shoulder pain, initial encounter EXAM: LEFT SHOULDER - 2+ VIEW COMPARISON:  None. FINDINGS: There is no evidence of fracture or dislocation. There is no evidence of arthropathy or other focal bone abnormality. Soft tissues are unremarkable. IMPRESSION: Choose 1 Electronically Signed   By: Inez Catalina M.D.   On: 06/28/2020 02:13   DG Hip Unilat W or Wo Pelvis 2-3 Views Left  Result Date: 06/28/2020 CLINICAL DATA:  Fall, left hip pain EXAM: DG HIP (WITH OR WITHOUT PELVIS) 2-3V LEFT COMPARISON:  None. FINDINGS: There is no evidence of hip fracture or dislocation. There is diffuse osteopenia. Moderate bilateral hip osteoarthritis is seen with superior joint space loss. Scattered vascular calcifications are noted. IMPRESSION: No acute osseous abnormality. Electronically Signed   By: Prudencio Pair M.D.   On: 06/28/2020 02:12    EKG: Independently reviewed.  Sinus bradycardia, RBBB.  Rate slower since last tracing.  Assessment/Plan Principal Problem:   Bradycardia Active Problems:   CAD (coronary artery disease)   Syncope   Acute urinary retention   Hyponatremia  Sinus bradycardia/syncope: Does have baseline sinus bradycardia and is followed by cardiology.  Rate as low as 30s.  No hypotension.  Not on an AV nodal blocking agent.  Syncope may also be possibly due to vasovagal response as it happened during micturition. -Cardiac  monitoring.  Check orthostatics.  Consult cardiology in a.m.  Falls -PT and OT evaluation, fall precautions.  Acute urinary retention, BPH -Foley placed in the ED. Continue home Flomax.  Hyponatremia: Sodium 125.  Unclear exact etiology.  Patient is not endorsing vomiting, diarrhea, or poor oral intake. -Gentle IV  fluid hydration and monitor BMP every 4 hours.  Check serum osmolarity.  Ischemic dilated cardiomyopathy: Echo done March 2019 showing EF 40 to 45% and grade 2 diastolic dysfunction.  No signs of volume overload at this time. -Continue ramipril.  Not on a beta-blocker due to baseline sinus bradycardia.  CAD: Status post PCI in 2013.  He had a low risk nuclear stress test in March 2019.  Not endorsing any anginal symptoms. High-sensitivity troponin negative and EKG without acute ischemic changes. -Continue aspirin and statin.  Not on a beta-blocker due to baseline sinus bradycardia.  DVT prophylaxis: Lovenox Code Status: Full code-confirmed with the patient Family Communication: No family available at this time. Disposition Plan: Status is: Observation  The patient remains OBS appropriate and will d/c before 2 midnights.  Dispo: The patient is from: Home              Anticipated d/c is to: Home              Anticipated d/c date is: 1 day              Patient currently is not medically stable to d/c.  The medical decision making on this patient was of high complexity and the patient is at high risk for clinical deterioration, therefore this is a level 3 visit.  Shela Leff MD [facility]  If 7PM-7AM, please contact night-coverage [email or web address]  06/28/2020, 5:13 AM

## 2020-06-28 NOTE — Discharge Summary (Signed)
Physician Discharge Summary   Patient ID: Patrick Barnes MRN: [id number] DOB/AGE: 1951/09/28 69 y.o.  Admit date: 06/28/2020 Discharge date: 06/28/2020  Primary Care Physician:  Leighton Ruff, MD   Recommendations for Outpatient Follow-up:  1. Follow up with PCP in 1-2 weeks 2. Hold Zoloft, recheck BMET at the follow-up appointment in 1 week 3. Patient placed on finasteride.  Flomax discontinued due to syncope possible vasovagal versus orthostasis.  Home Health: None, back to baseline Equipment/Devices:   Discharge Condition: stable CODE STATUS: FULL Diet recommendation: Heart healthy diet   Discharge Diagnoses:    . Syncope with sinus bradycardia Acute hyponatremia . CAD (coronary artery disease) Mechanical fall History of BPH  Consults: Cardiology    Allergies:   Allergies  Allergen Reactions  . Quinolones     Prolonged QT     DISCHARGE MEDICATIONS: Allergies as of 06/28/2020      Reactions   Quinolones    Prolonged QT      Medication List    STOP taking these medications   tamsulosin 0.4 MG Caps capsule Commonly known as: FLOMAX     TAKE these medications   acetaminophen 500 MG tablet Commonly known as: TYLENOL Take 1,000 mg by mouth daily as needed. For sinus headaches   BABY ASPIRIN PO Take 81 mg by mouth daily.   cholecalciferol 25 MCG (1000 UNIT) tablet Commonly known as: VITAMIN D3 Take 1,000 Units by mouth daily.   Denta 5000 Plus 1.1 % Crea dental cream Generic drug: sodium fluoride Place 1 application onto teeth at bedtime.   finasteride 5 MG tablet Commonly known as: PROSCAR Take 1 tablet (5 mg total) by mouth daily. Start taking on: June 29, 2020   Fortesta 10 MG/ACT (2%) Gel Generic drug: Testosterone Place 4 Squirts onto the skin daily.   Melatonin 3 MG Caps Take 3 mg by mouth daily as needed (sleep).   MULTIPLE VITAMIN PO Take 1 tablet by mouth every other day.   nitroGLYCERIN 0.4 MG SL tablet Commonly known as:  NITROSTAT PLACE 1 TABLET UNDER TONGUE EVERY 5 MINUTES AS NEEDED FOR CHEST PAIN.[name] Kitchen MAXIMUM OF 3 DOSES   ramipril 2.5 MG capsule Commonly known as: ALTACE Take 2.5 mg by mouth daily.   sertraline 50 MG tablet Commonly known as: ZOLOFT Take 1.5 tablets (75 mg total) by mouth daily. HOLD UNTIL FOLLOW-UP WITH YOUR PCP What changed: additional instructions   simvastatin 40 MG tablet Commonly known as: ZOCOR Take 40 mg by mouth daily.        Brief H and P: For complete details please refer to admission H and P, but in brief Patrick Barnes a 69 y.o.malewith medical history significant ofanxiety, bradycardia, PVCs and VT status post ablation, CAD status post PCI in 2013, ischemic dilated cardiomyopathy (EF 40-45 %), hyperlipidemia, hypogonadotropic hypogonadism followed by endocrinologypresenting to the ED for evaluation of head injury, left shoulder, and left hip pain after 2 falls at home.  Patient reported first time he fell as he was walking to the kitchen and tripped on a vacuum cleaner.  Second time he was in the bathroom straining to urinate and then just remembers waking up.  He hit his head on the tub and fell onto his left side.  No preceding dizziness, shortness of breath or chest pain.  States appetite has been good, no nausea vomiting or diarrhea  In ED, was noted to be bradycardic with heart rate as low as 30s, no hypotension.  Hemoglobin 12.0.  Sodium 125. Imagings  negative for fracture or dislocation.  Hospital Course:  Syncope with bradycardia -On chart review and cardiology office notes, patient has history of bradycardia however rate has been in 40s and 50s.  Heart rate was low in 30s in ED.  Chest x-ray with no acute cardiopulmonary disease. No high-grade blocks on EKG, not on AV nodal blocking agents -No significant metabolic derangements.  Has been on Flomax which was just started in the last 6 weeks. -Initial fall was mechanical when he tripped over the vacuum  cleaner however the second episode appears to be more like micturition syncope causing vasovagal episode -Prior echo 01/2018 had shown EF of 40 to 45% with grade 2 diastolic dysfunction.   -I have held the Flomax (can cause orthostatic hypotension) and placed him on finasteride instead  -Cardiology was consulted, seen by Dr. Katharina Caper.  Heart rate is low at baseline and is unchanged, patient has an appointment with Dr. Radford Pax, recommended follow-up with Dr. Radford Pax and Dr. Rayann Heman.  No other cardiology interventions needed at this time.  Acute hyponatremia -Unclear etiology except patient is on Zoloft, states that he has been taking it for a long time.  Denies any dehydration, any vomiting or diarrhea recently.  States no issues with appetite. -Patient received 500 mL fluid bolus.,  Sodium improving 125-> 128> 131 > 132 -Patient recommended to hold Zoloft until follow-up with PCP.  Will need outpatient BMET in 1 week -Serum osmolality 272, urine osmolality 203, urine sodium 33,    CAD (coronary artery disease), history of ischemic dilated cardiomyopathy -2D echo 3/29 had shown EF of 40 to 45% with G2 DD -Cardiology consulted, EKG with sinus bradycardia, RBBB, not on AV nodal blocking agents  Mechanical fall -PT OT evaluation recommended at baseline, no PT OT follow-up needed -Currently no focal weakness, ambulated with PT, steady gait  History of BPH -Patient has been following with [facility], per patient was started on Flomax 6 weeks ago -While straining at urination when had a vasovagal episode.  In ED, patient was found to have acute urinary retention and Foley was placed -For now Flomax held and changed to finasteride.  Recommended to follow-up with urology outpatient   Day of Discharge S: No acute complaints, ambulated with PT, steady gait.  Patient continues to have bradycardia however asymptomatic, sinus.  Cleared by cardiology to be discharged home.  BP 124/73   Pulse (!) 39    Temp 97.7 F (36.5 C) (Oral)   Resp (!) 8   Ht 5\' 6"  (1.676 m)   Wt 56.7 kg   SpO2 98%   BMI 20.18 kg/m   Physical Exam: General: Alert and awake oriented x3 not in any acute distress. HEENT: anicteric sclera, pupils reactive to light and accommodation CVS: S1-S2 clear no murmur rubs or gallops, bradycardia Chest: clear to auscultation bilaterally, no wheezing rales or rhonchi Abdomen: soft nontender, nondistended, normal bowel sounds Extremities: no cyanosis, clubbing or edema noted bilaterally Neuro: Cranial nerves II-XII intact, no focal neurological deficits    Get Medicines reviewed and adjusted: Please take all your medications with you for your next visit with your Primary MD  Please request your Primary MD to go over all hospital tests and procedure/radiological results at the follow up. Please ask your Primary MD to get all Hospital records sent to his/her office.  If you experience worsening of your admission symptoms, develop shortness of breath, life threatening emergency, suicidal or homicidal thoughts you must seek medical attention immediately by calling 911  or calling your MD immediately  if symptoms less severe.  You must read complete instructions/literature along with all the possible adverse reactions/side effects for all the Medicines you take and that have been prescribed to you. Take any new Medicines after you have completely understood and accept all the possible adverse reactions/side effects.   Do not drive when taking pain medications.   Do not take more than prescribed Pain, Sleep and Anxiety Medications  Special Instructions: If you have smoked or chewed Tobacco  in the last 2 yrs please stop smoking, stop any regular Alcohol  and or any Recreational drug use.  Wear Seat belts while driving.  Please note  You were cared for by a hospitalist during your hospital stay. Once you are discharged, your primary care physician will handle any further  medical issues. Please note that NO REFILLS for any discharge medications will be authorized once you are discharged, as it is imperative that you return to your primary care physician (or establish a relationship with a primary care physician if you do not have one) for your aftercare needs so that they can reassess your need for medications and monitor your lab values.   The results of significant diagnostics from this hospitalization (including imaging, microbiology, ancillary and laboratory) are listed below for reference.      Procedures/Studies:  DG Chest 2 View  Result Date: 06/28/2020 CLINICAL DATA:  Recent fall with chest pain, initial encounter EXAM: CHEST - 2 VIEW COMPARISON:  02/25/2018 FINDINGS: Cardiac shadow is within normal limits. The lungs are well aerated bilaterally. Some scarring is noted in the upper lobes bilaterally. No sizable effusion is seen. No acute bony abnormality is noted. IMPRESSION: No acute abnormality noted. Electronically Signed   By: Inez Catalina M.D.   On: 06/28/2020 02:12   CT Head Wo Contrast  Result Date: 06/28/2020 CLINICAL DATA:  Fall, laceration the high left ear EXAM: CT HEAD WITHOUT CONTRAST TECHNIQUE: Contiguous axial images were obtained from the base of the skull through the vertex without intravenous contrast. COMPARISON:  None. FINDINGS: Brain: No evidence of acute territorial infarction, hemorrhage, hydrocephalus,extra-axial collection or mass lesion/mass effect. There is dilatation the ventricles and sulci consistent with age-related atrophy. Low-attenuation changes in the deep white matter consistent with small vessel ischemia. Vascular: No hyperdense vessel or unexpected calcification. Skull: The skull is intact. No fracture or focal lesion identified. Sinuses/Orbits: The visualized paranasal sinuses and mastoid air cells are clear. The orbits and globes intact. Other: None Cervical spine: Alignment: Physiologic Skull base and vertebrae:  Visualized skull base is intact. No atlanto-occipital dissociation. The vertebral body heights are well maintained. No fracture or pathologic osseous lesion seen. Soft tissues and spinal canal: The visualized paraspinal soft tissues are unremarkable. No prevertebral soft tissue swelling is seen. The spinal canal is grossly unremarkable, no large epidural collection or significant canal narrowing. Disc levels: Cervical spine spondylosis is seen with disc height loss, disc osteophyte complex and uncovertebral osteophytes most notable at C4-C5 with moderate neural foraminal narrowing and mild central canal stenosis. Upper chest: Biapical scarring is noted. Thoracic inlet is within normal limits. Other: None IMPRESSION: No acute intracranial abnormality. Findings consistent with age related atrophy and chronic small vessel ischemia No acute fracture or malalignment of the spine. Electronically Signed   By: Prudencio Pair M.D.   On: 06/28/2020 02:15   CT Cervical Spine Wo Contrast  Result Date: 06/28/2020 CLINICAL DATA:  Fall, laceration the high left ear EXAM: CT HEAD WITHOUT CONTRAST  TECHNIQUE: Contiguous axial images were obtained from the base of the skull through the vertex without intravenous contrast. COMPARISON:  None. FINDINGS: Brain: No evidence of acute territorial infarction, hemorrhage, hydrocephalus,extra-axial collection or mass lesion/mass effect. There is dilatation the ventricles and sulci consistent with age-related atrophy. Low-attenuation changes in the deep white matter consistent with small vessel ischemia. Vascular: No hyperdense vessel or unexpected calcification. Skull: The skull is intact. No fracture or focal lesion identified. Sinuses/Orbits: The visualized paranasal sinuses and mastoid air cells are clear. The orbits and globes intact. Other: None Cervical spine: Alignment: Physiologic Skull base and vertebrae: Visualized skull base is intact. No atlanto-occipital dissociation. The  vertebral body heights are well maintained. No fracture or pathologic osseous lesion seen. Soft tissues and spinal canal: The visualized paraspinal soft tissues are unremarkable. No prevertebral soft tissue swelling is seen. The spinal canal is grossly unremarkable, no large epidural collection or significant canal narrowing. Disc levels: Cervical spine spondylosis is seen with disc height loss, disc osteophyte complex and uncovertebral osteophytes most notable at C4-C5 with moderate neural foraminal narrowing and mild central canal stenosis. Upper chest: Biapical scarring is noted. Thoracic inlet is within normal limits. Other: None IMPRESSION: No acute intracranial abnormality. Findings consistent with age related atrophy and chronic small vessel ischemia No acute fracture or malalignment of the spine. Electronically Signed   By: Prudencio Pair M.D.   On: 06/28/2020 02:15   DG Shoulder Left  Result Date: 06/28/2020 CLINICAL DATA:  Recent fall in bathtub with left shoulder pain, initial encounter EXAM: LEFT SHOULDER - 2+ VIEW COMPARISON:  None. FINDINGS: There is no evidence of fracture or dislocation. There is no evidence of arthropathy or other focal bone abnormality. Soft tissues are unremarkable. IMPRESSION: Choose 1 Electronically Signed   By: Inez Catalina M.D.   On: 06/28/2020 02:13   DG Hip Unilat W or Wo Pelvis 2-3 Views Left  Result Date: 06/28/2020 CLINICAL DATA:  Fall, left hip pain EXAM: DG HIP (WITH OR WITHOUT PELVIS) 2-3V LEFT COMPARISON:  None. FINDINGS: There is no evidence of hip fracture or dislocation. There is diffuse osteopenia. Moderate bilateral hip osteoarthritis is seen with superior joint space loss. Scattered vascular calcifications are noted. IMPRESSION: No acute osseous abnormality. Electronically Signed   By: Prudencio Pair M.D.   On: 06/28/2020 02:12       LAB RESULTS: Basic Metabolic Panel: Recent Labs  Lab 06/28/20 1105 06/28/20 1320  NA 131* 132*  K 3.9 4.2  CL  96* 96*  CO2 25 24  GLUCOSE 100* 96  BUN 12 12  CREATININE 0.76 0.68  CALCIUM 8.3* 8.4*   Liver Function Tests: No results for input(s): AST, ALT, ALKPHOS, BILITOT, PROT, ALBUMIN in the last 168 hours. No results for input(s): LIPASE, AMYLASE in the last 168 hours. No results for input(s): AMMONIA in the last 168 hours. CBC: Recent Labs  Lab 06/28/20 0123  WBC 10.5  NEUTROABS 8.7*  HGB 12.0*  HCT 37.4*  MCV 99.5  PLT 206   Cardiac Enzymes: No results for input(s): CKTOTAL, CKMB, CKMBINDEX, TROPONINI in the last 168 hours. BNP: Invalid input(s): POCBNP CBG: No results for input(s): GLUCAP in the last 168 hours.     Disposition and Follow-up:    DISPOSITION: Home   DISCHARGE FOLLOW-UP  Follow-up Information    Leighton Ruff, MD. Schedule an appointment as soon as possible for a visit in 2 week(s).   Specialty: Family Medicine Contact information: [place] [id number]  [place]. Schedule an appointment as soon as possible for a visit in 2 week(s).   Contact information: [place] [phone]               Time coordinating discharge:  35 minutes Signed:   Estill Cotta M.D. [facility] 06/28/2020, 2:38 PM

## 2020-06-28 NOTE — ED Notes (Signed)
Pt discharged from this ED in stable condition at this time. All discharge instructions and follow up care reviewed with pt with no further questions at this time. Pt ambulatory with steady gait, clear speech.  

## 2020-06-28 NOTE — ED Provider Notes (Signed)
[facility] DEPT Provider Note   CSN: [id number] Arrival date & time: 06/27/20  2335     History Chief Complaint  Patient presents with  . Fall  . Head Injury    Patrick Barnes is a 69 y.o. male.  Patient with history of CAD status post 1 stent, dilated cardiomyopathy with EF of 50%, frequent PVCs presenting with head injury after 2 falls today.  States the first fall happened around noon time.  He stood up to go to be open and tripped over a vacuum.  He hit his head on the left side against the ground.  No loss of consciousness.  Patient had a second fall this evening while he was standing at the toilet trying to urinate.  States he was having a hard time passing urine when he pushed too hard and fell backwards into the bathtub.  He struck the left side of his head again as well as his left shoulder and left hip.  Sustained small laceration behind his left ear.  Denies losing consciousness.  Denies any preceding dizziness or lightheadedness.  No chest pain or shortness of breath.  Takes aspirin but no other blood thinners.  Found to be bradycardic in triage in the 40s but he states this is normal.  Denies feeling dizzy or lightheaded.  States he has problems with urination from time to time and is seeing a urologist.  Complains of pain to his left head, left shoulder and left hip.  No neck or back pain.  No chest or abdominal pain.  No difficulty breathing   The history is provided by the patient.  Fall Associated symptoms include headaches. Pertinent negatives include no shortness of breath.  Head Injury Associated symptoms: headache   Associated symptoms: no neck pain        Past Medical History:  Diagnosis Date  . Anxiety   . Bradycardia 07/23/2016  . CAD (coronary artery disease) 01/2012   cardiac cath...with 80% stenosis in proximal LAD with PCI     . Dilated cardiomyopathy ([place])    Echo 11/2012 EF 45-50%  . Dyslipidemia   . Elevated CPK     Proboly due to excessive exercise.  . Elevated LFTs    history of-Now resolved  . H/O vasectomy   . Hx of tonsillectomy   . Hypogonadotropic hypogonadism (HCC)    Dr. Buddy Duty and Dr Jeffie Pollock  . Inguinal hernia   . Low bone density for age    endo is following  . Low testosterone   . PVC's (premature ventricular contractions)    s/p ablation Dr Rayann Heman and Dr Radford Pax  . RBBB   . Vitamin D deficiency     Patient Active Problem List   Diagnosis Date Noted  . RBBB   . Bradycardia 07/23/2016  . Hyperlipidemia   . CAD (coronary artery disease) 01/14/2012  . Dilated cardiomyopathy ([place]) 01/12/2012  . PVC (premature ventricular contraction) 01/12/2012  . Hypogonadism male 01/12/2012    Past Surgical History:  Procedure Laterality Date  . CARDIAC CATHETERIZATION     w 80% stenosis in proimal LAD w PCI on 2/13.  [name] Kitchen COLONOSCOPY  10/2005   had internal nonbleeding small hemorrhoids,repeat in 10 years.  . CYSTECTOMY    . EP study and ablation for RVOT PVCs  08/12/12  . HERNIA REPAIR     Inguinal  . PERCUTANEOUS CORONARY STENT INTERVENTION (PCI-S) Bilateral 01/13/2012   Procedure: PERCUTANEOUS CORONARY STENT INTERVENTION (PCI-S);  Surgeon: Jettie Booze,  MD;  Location: [facility] CATH LAB;  Service: Cardiovascular;  Laterality: Bilateral;  possible radial artery  . TONSILLECTOMY    . V-TACH ABLATION N/A 08/11/2012   Procedure: V-TACH ABLATION;  Surgeon: Thompson Grayer, MD;  Location: [facility] CATH LAB;  Service: Cardiovascular;  Laterality: N/A;  . VASECTOMY         Family History  Problem Relation Age of Onset  . Cancer Mother        died at 58yo    Social History   Tobacco Use  . Smoking status: Never Smoker  . Smokeless tobacco: Never Used  Vaping Use  . Vaping Use: Never used  Substance Use Topics  . Alcohol use: Yes    Comment: occasional  . Drug use: No    Home Medications Prior to Admission medications   Medication Sig Start Date End Date Taking? Authorizing Provider    acetaminophen (TYLENOL) 500 MG tablet Take 1,000 mg by mouth daily as needed. For sinus headaches    [provider]  BABY ASPIRIN PO Take 81 mg by mouth daily.    [provider]  cholecalciferol (VITAMIN D3) 25 MCG (1000 UT) tablet Take 1,000 Units by mouth daily.    [provider]  DENTA 5000 PLUS 1.1 % CREA dental cream Place 1 application onto teeth at bedtime.  12/06/13   [provider]  Melatonin 3 MG CAPS Take 3 mg by mouth daily as needed.    [provider]  MULTIPLE VITAMIN PO Take 1 tablet by mouth daily.    [provider]  nitroGLYCERIN (NITROSTAT) 0.4 MG SL tablet PLACE 1 TABLET UNDER TONGUE EVERY 5 MINUTES AS NEEDED FOR CHEST PAIN.[name] Kitchen MAXIMUM OF 3 DOSES 05/23/18   Sueanne Margarita, MD  ramipril (ALTACE) 2.5 MG capsule Take 2.5 mg by mouth daily.  12/09/18   [provider]  sertraline (ZOLOFT) 50 MG tablet Take 50 mg by mouth daily.    [provider]  simvastatin (ZOCOR) 40 MG tablet Take 40 mg by mouth daily.    [provider]  Testosterone (FORTESTA) 10 MG/ACT (2%) GEL Place 4 Squirts onto the skin daily.     [provider]    Allergies    Quinolones  Review of Systems   Review of Systems  Constitutional: Negative for activity change, appetite change, fatigue and fever.  HENT: Negative for congestion and rhinorrhea.   Eyes: Negative for visual disturbance.  Respiratory: Negative for cough, chest tightness and shortness of breath.   Genitourinary: Negative for dysuria and hematuria.  Musculoskeletal: Positive for arthralgias and myalgias. Negative for neck pain.  Skin: Negative for rash.  Neurological: Positive for headaches. Negative for weakness.    all other systems are negative except as noted in the HPI and PMH.   Physical Exam Updated Vital Signs BP 122/76 (BP Location: Left Arm)   Pulse (!) 41 Comment: Pt. states " I have a low pulse this is normal"  Temp 97.7 F (36.5  C) (Oral)   Resp 17   Ht 5\' 6"  (1.676 m)   Wt 56.7 kg   SpO2 100%   BMI 20.18 kg/m   Physical Exam Vitals and nursing note reviewed.  Constitutional:      General: He is not in acute distress.    Appearance: He is well-developed.  HENT:     Head: Normocephalic.     Comments: Small punctate laceration behind left ear    Mouth/Throat:     Pharynx: No  oropharyngeal exudate.  Eyes:     Conjunctiva/sclera: Conjunctivae normal.     Pupils: Pupils are equal, round, and reactive to light.  Neck:     Comments: No C-spine tenderness Cardiovascular:     Rate and Rhythm: Regular rhythm. Bradycardia present.     Heart sounds: Normal heart sounds. No murmur heard.   Pulmonary:     Effort: Pulmonary effort is normal. No respiratory distress.     Breath sounds: Normal breath sounds.  Abdominal:     Palpations: Abdomen is soft.     Tenderness: There is no abdominal tenderness. There is no guarding or rebound.  Musculoskeletal:        General: Tenderness present. Normal range of motion.     Cervical back: Normal range of motion and neck supple.     Comments: Pain with range of motion of left shoulder.  No T or L-spine tenderness.  Full range of motion hips without pain.  Skin:    General: Skin is warm.  Neurological:     Mental Status: He is alert and oriented to person, place, and time.     Cranial Nerves: No cranial nerve deficit.     Motor: No abnormal muscle tone.     Coordination: Coordination normal.     Comments: No ataxia on finger to nose bilaterally. No pronator drift. 5/5 strength throughout. CN 2-12 intact.Equal grip strength. Sensation intact.   Psychiatric:        Behavior: Behavior normal.     ED Results / Procedures / Treatments   Labs (all labs ordered are listed, but only abnormal results are displayed) Labs Reviewed  CBC WITH DIFFERENTIAL/PLATELET - Abnormal; Notable for the following components:      Result Value   RBC 3.76 (*)    Hemoglobin 12.0 (*)     HCT 37.4 (*)    Neutro Abs 8.7 (*)    All other components within normal limits  BASIC METABOLIC PANEL - Abnormal; Notable for the following components:   Sodium 125 (*)    Chloride 94 (*)    Calcium 8.6 (*)    All other components within normal limits  BASIC METABOLIC PANEL - Abnormal; Notable for the following components:   Sodium 128 (*)    Potassium 3.3 (*)    Creatinine, Ser 0.57 (*)    Calcium 7.3 (*)    All other components within normal limits  SARS CORONAVIRUS 2 BY RT PCR (HOSPITAL ORDER, [facility])  HIV ANTIBODY (ROUTINE TESTING W REFLEX)  OSMOLALITY  BASIC METABOLIC PANEL  BASIC METABOLIC PANEL  SODIUM, URINE, RANDOM  OSMOLALITY, URINE  TROPONIN I (HIGH SENSITIVITY)  TROPONIN I (HIGH SENSITIVITY)    EKG EKG Interpretation  Date/Time:  Friday June 28 2020 02:41:43 EDT Ventricular Rate:  44 PR Interval:    QRS Duration: 166 QT Interval:  531 QTC Calculation: 455 R Axis:   64 Text Interpretation: Sinus bradycardia Right bundle branch block Anteroseptal infarct, age indeterminate Lateral leads are also involved Baseline wander in lead(s) V3 No significant change was found Confirmed by Ezequiel Essex [phone]) on 06/28/2020 2:56:10 AM   Radiology DG Chest 2 View  Result Date: 06/28/2020 CLINICAL DATA:  Recent fall with chest pain, initial encounter EXAM: CHEST - 2 VIEW COMPARISON:  02/25/2018 FINDINGS: Cardiac shadow is within normal limits. The lungs are well aerated bilaterally. Some scarring is noted in the upper lobes bilaterally. No sizable effusion is seen. No acute bony abnormality is noted.  IMPRESSION: No acute abnormality noted. Electronically Signed   By: Inez Catalina M.D.   On: 06/28/2020 02:12   CT Head Wo Contrast  Result Date: 06/28/2020 CLINICAL DATA:  Fall, laceration the high left ear EXAM: CT HEAD WITHOUT CONTRAST TECHNIQUE: Contiguous axial images were obtained from the base of the skull through the vertex without  intravenous contrast. COMPARISON:  None. FINDINGS: Brain: No evidence of acute territorial infarction, hemorrhage, hydrocephalus,extra-axial collection or mass lesion/mass effect. There is dilatation the ventricles and sulci consistent with age-related atrophy. Low-attenuation changes in the deep white matter consistent with small vessel ischemia. Vascular: No hyperdense vessel or unexpected calcification. Skull: The skull is intact. No fracture or focal lesion identified. Sinuses/Orbits: The visualized paranasal sinuses and mastoid air cells are clear. The orbits and globes intact. Other: None Cervical spine: Alignment: Physiologic Skull base and vertebrae: Visualized skull base is intact. No atlanto-occipital dissociation. The vertebral body heights are well maintained. No fracture or pathologic osseous lesion seen. Soft tissues and spinal canal: The visualized paraspinal soft tissues are unremarkable. No prevertebral soft tissue swelling is seen. The spinal canal is grossly unremarkable, no large epidural collection or significant canal narrowing. Disc levels: Cervical spine spondylosis is seen with disc height loss, disc osteophyte complex and uncovertebral osteophytes most notable at C4-C5 with moderate neural foraminal narrowing and mild central canal stenosis. Upper chest: Biapical scarring is noted. Thoracic inlet is within normal limits. Other: None IMPRESSION: No acute intracranial abnormality. Findings consistent with age related atrophy and chronic small vessel ischemia No acute fracture or malalignment of the spine. Electronically Signed   By: Prudencio Pair M.D.   On: 06/28/2020 02:15   CT Cervical Spine Wo Contrast  Result Date: 06/28/2020 CLINICAL DATA:  Fall, laceration the high left ear EXAM: CT HEAD WITHOUT CONTRAST TECHNIQUE: Contiguous axial images were obtained from the base of the skull through the vertex without intravenous contrast. COMPARISON:  None. FINDINGS: Brain: No evidence of acute  territorial infarction, hemorrhage, hydrocephalus,extra-axial collection or mass lesion/mass effect. There is dilatation the ventricles and sulci consistent with age-related atrophy. Low-attenuation changes in the deep white matter consistent with small vessel ischemia. Vascular: No hyperdense vessel or unexpected calcification. Skull: The skull is intact. No fracture or focal lesion identified. Sinuses/Orbits: The visualized paranasal sinuses and mastoid air cells are clear. The orbits and globes intact. Other: None Cervical spine: Alignment: Physiologic Skull base and vertebrae: Visualized skull base is intact. No atlanto-occipital dissociation. The vertebral body heights are well maintained. No fracture or pathologic osseous lesion seen. Soft tissues and spinal canal: The visualized paraspinal soft tissues are unremarkable. No prevertebral soft tissue swelling is seen. The spinal canal is grossly unremarkable, no large epidural collection or significant canal narrowing. Disc levels: Cervical spine spondylosis is seen with disc height loss, disc osteophyte complex and uncovertebral osteophytes most notable at C4-C5 with moderate neural foraminal narrowing and mild central canal stenosis. Upper chest: Biapical scarring is noted. Thoracic inlet is within normal limits. Other: None IMPRESSION: No acute intracranial abnormality. Findings consistent with age related atrophy and chronic small vessel ischemia No acute fracture or malalignment of the spine. Electronically Signed   By: Prudencio Pair M.D.   On: 06/28/2020 02:15   DG Shoulder Left  Result Date: 06/28/2020 CLINICAL DATA:  Recent fall in bathtub with left shoulder pain, initial encounter EXAM: LEFT SHOULDER - 2+ VIEW COMPARISON:  None. FINDINGS: There is no evidence of fracture or dislocation. There is no evidence of arthropathy or other  focal bone abnormality. Soft tissues are unremarkable. IMPRESSION: Choose 1 Electronically Signed   By: Inez Catalina M.D.    On: 06/28/2020 02:13   DG Hip Unilat W or Wo Pelvis 2-3 Views Left  Result Date: 06/28/2020 CLINICAL DATA:  Fall, left hip pain EXAM: DG HIP (WITH OR WITHOUT PELVIS) 2-3V LEFT COMPARISON:  None. FINDINGS: There is no evidence of hip fracture or dislocation. There is diffuse osteopenia. Moderate bilateral hip osteoarthritis is seen with superior joint space loss. Scattered vascular calcifications are noted. IMPRESSION: No acute osseous abnormality. Electronically Signed   By: Prudencio Pair M.D.   On: 06/28/2020 02:12    Procedures .[name] KitchenLaceration Repair  Date/Time: 06/28/2020 3:17 AM Performed by: Ezequiel Essex, MD Authorized by: Ezequiel Essex, MD   Consent:    Consent obtained:  Verbal   Consent given by:  Patient   Risks discussed:  Infection, need for additional repair, poor wound healing, poor cosmetic result, pain, retained foreign body, tendon damage, vascular damage and nerve damage Anesthesia (see MAR for exact dosages):    Anesthesia method:  None Laceration details:    Location:  Scalp   Scalp location:  Occipital   Length (cm):  1 Repair type:    Repair type:  Simple Pre-procedure details:    Preparation:  Patient was prepped and draped in usual sterile fashion and imaging obtained to evaluate for foreign bodies Exploration:    Wound exploration: wound explored through full range of motion and entire depth of wound probed and visualized     Wound extent: no foreign bodies/material noted, no muscle damage noted and no underlying fracture noted     Contaminated: no   Treatment:    Area cleansed with:  Betadine   Amount of cleaning:  Standard Skin repair:    Repair method:  Staples   Number of staples:  1 Approximation:    Approximation:  Close Post-procedure details:    Dressing:  Adhesive bandage   Patient tolerance of procedure:  Tolerated well, no immediate complications   (including critical care time)  Medications Ordered in ED Medications    lidocaine-EPINEPHrine (XYLOCAINE W/EPI) 2 %-1:200000 (PF) injection 20 mL (has no administration in time range)    ED Course  I have reviewed the triage vital signs and the nursing notes.  Pertinent labs & imaging results that were available during my care of the patient were reviewed by me and considered in my medical decision making (see chart for details).    MDM Rules/Calculators/A&P                         Fall today x2.  First fall sounds mechanical.  Second fall was in setting of trying to urinate and possible vagal episode.  Did not lose consciousness.  Sustained head injury as well as left shoulder and left hip pain.  Patient bradycardic in the 30s and 40s.  EKG shows sinus rhythm without any evidence of dropped beats.  Patient states he always has a slow heart rate but has never been this low.  He does not take any AV nodal blockers.  CT head and C-spine are negative.  Laceration repaired as above.  Patient does have 500 cc of urine in his bladder will place Foley catheter.  Suspect he may have some component of micturition syncope.  His bradycardia may exacerbate this. Also with hyponatremia of 125. Will hydrate gently.  He is agreeable to overnight observation for his bradycardia  Discussed with Dr. Marlowe Sax. Final Clinical Impression(s) / ED Diagnoses Final diagnoses:  Fall, initial encounter  Laceration of scalp, initial encounter  Bradycardia    Rx / DC Orders ED Discharge Orders    None       Carlin Mamone, Annie Main, MD 06/28/20 [phone]

## 2020-06-28 NOTE — Progress Notes (Signed)
[facility]                                                                              Patient Demographics  Patrick Barnes, is a 69 y.o. male, DOB - 15-Aug-1951, OXB:[id number]  Admit date - 06/28/2020   Admitting Physician Shela Leff, MD  Outpatient Primary MD for the patient is Leighton Ruff, MD  Outpatient specialists:   LOS - 0  days   Medical records reviewed and are as summarized below:    Chief Complaint  Patient presents with  . Fall  . Head Injury       Brief summary  Patrick Barnes is a 69 y.o. male with medical history significant of anxiety, bradycardia, PVCs and VT status post ablation, CAD status post PCI in 2013, ischemic dilated cardiomyopathy (EF 40-45 %), hyperlipidemia, hypogonadotropic hypogonadism followed by endocrinology presenting to the ED for evaluation of head injury, left shoulder, and left hip pain after 2 falls at home.  Patient reported first time he fell as he was walking to the kitchen and tripped on a vacuum cleaner.  Second time he was in the bathroom straining to urinate and then just remembers waking up.  He hit his head on the tub and fell onto his left side.  No preceding dizziness, shortness of breath or chest pain.  States appetite has been good, no nausea vomiting or diarrhea  In ED, was noted to be bradycardic with heart rate as low as 30s, no hypotension.  Hemoglobin 12.0.  Sodium 125. Imagings negative for fracture or dislocation.  Assessment & Plan    Principal Problem: Syncope with bradycardia -On chart review and cardiology office notes, patient has history of bradycardia however rate has been in 40s and 50s.  Heart rate was low in 30s in ED.  Chest x-ray with no acute cardiopulmonary disease. No high-grade blocks on EKG, not on AV nodal blocking agents -No significant metabolic derangements.  Has been on Flomax which was just started in the last 6 weeks. -Initial fall was mechanical when he tripped over  the vacuum cleaner however the second episode appears to be more like micturition syncope causing vasovagal episode -Requested cardiology consult for their recommendations -Prior echo 01/2018 had shown EF of 40 to 45% with grade 2 diastolic dysfunction.  Will follow cardiology recommendations for repeating echocardiogram.   -I have held the Flomax (can cause orthostatic hypotension) and placed him on finasteride instead   Active Problems: Acute hyponatremia -Unclear etiology except patient is on Zoloft, states that he has been taking it for a long time.  Denies any dehydration, any vomiting or diarrhea recently.  States no issues with appetite. -Patient received 500 mL fluid bolus.,  Sodium improving 125-> 128> 131  -Hold Zoloft -Serum osmolality 272, urine osmolality 203, urine sodium 33, will continue gentle hydration    CAD (coronary artery disease), history of ischemic dilated cardiomyopathy -2D echo 3/29 had shown EF of 40 to 45% with G2 DD -Cardiology consulted, EKG with sinus bradycardia, RBBB, not on AV nodal blocking agents  Mechanical fall -PT OT evaluation -Currently no focal weakness  History of BPH -Patient has  been following with [facility], per patient was started on Flomax 6 weeks ago -While straining at urination when had a vasovagal episode.  In ED, patient was found to have acute urinary retention and Foley was placed -For now Flomax held and changed to finasteride.  Recommended to follow-up with urology outpatient  Code Status: Full CODE STATUS DVT Prophylaxis:  Lovenox  Family Communication: Discussed all imaging results, lab results, explained to the patient or *   Disposition Plan:     Status is: Observation  The patient remains OBS appropriate and will d/c before 2 midnights.  Dispo: The patient is from: Home              Anticipated d/c is to: Home              Anticipated d/c date is: 1 day              Patient currently is not medically stable to  d/c.  Awaiting repeat BMET, cardiology evaluation.   Time Spent in minutes 25 minutes  Procedures:  None  Consultants:   Cardiology  Antimicrobials:   Anti-infectives (From admission, onward)   None          Medications  Scheduled Meds: . aspirin EC  81 mg Oral Daily  . enoxaparin (LOVENOX) injection  40 mg Subcutaneous Q24H  . finasteride  5 mg Oral Daily  . ramipril  2.5 mg Oral Daily  . simvastatin  40 mg Oral Daily  . Testosterone  4 Squirt Transdermal Daily   Continuous Infusions: . sodium chloride 125 mL/hr at 06/28/20 0830   PRN Meds:.acetaminophen **OR** acetaminophen      Subjective:   [name] was seen and examined today.  No complaints per patient except soreness in the left shoulder and left hip.  Staple in the left side of the head.  Patient denies dizziness, chest pain, shortness of breath, abdominal pain, N/V/D/C, new weakness, numbess, tingling.     Objective:   Vitals:   06/28/20 1044 06/28/20 1111 06/28/20 1130 06/28/20 1134  BP: 116/69 120/76 117/75   Pulse: (!) 34  49 (!) 36  Resp: (!) 11  (!) 11 12  Temp:      TempSrc:      SpO2: 100%  100% 100%  Weight:      Height:        Intake/Output Summary (Last 24 hours) at 06/28/2020 1148 Last data filed at 06/28/2020 0830 Gross per 24 hour  Intake 260 ml  Output 1500 ml  Net -1240 ml     Wt Readings from Last 3 Encounters:  06/27/20 56.7 kg  02/07/19 60.1 kg  02/08/18 60.8 kg     Exam  General: Alert and oriented x 3, NAD  Cardiovascular: S1 S2 auscultated, no murmurs, RRR  Respiratory: Clear to auscultation bilaterally, no wheezing, rales or rhonchi  Gastrointestinal: Soft, nontender, nondistended, + bowel sounds  Ext: no pedal edema bilaterally  Neuro: AAOx3, Cr N's II- XII. Strength 5/5 upper and lower extremities bilaterally  Musculoskeletal: No digital cyanosis, clubbing  Skin: No rashes  Psych: Normal affect and demeanor, alert and oriented x3    Data  Reviewed:  I have personally reviewed following labs and imaging studies  Micro Results Recent Results (from the past 240 hour(s))  SARS Coronavirus 2 by RT PCR (hospital order, performed in [facility] hospital lab) Nasopharyngeal Nasopharyngeal Swab     Status: None   Collection Time: 06/28/20  3:10 AM  Specimen: Nasopharyngeal Swab  Result Value Ref Range Status   SARS Coronavirus 2 NEGATIVE NEGATIVE Final    Comment: (NOTE) SARS-CoV-2 target nucleic acids are NOT DETECTED.  The SARS-CoV-2 RNA is generally detectable in upper and lower respiratory specimens during the acute phase of infection. The lowest concentration of SARS-CoV-2 viral copies this assay can detect is 250 copies / mL. A negative result does not preclude SARS-CoV-2 infection and should not be used as the sole basis for treatment or other patient management decisions.  A negative result may occur with improper specimen collection / handling, submission of specimen other than nasopharyngeal swab, presence of viral mutation(s) within the areas targeted by this assay, and inadequate number of viral copies (<250 copies / mL). A negative result must be combined with clinical observations, patient history, and epidemiological information.  Fact Sheet for Patients:   StrictlyIdeas.no  Fact Sheet for Healthcare Providers: BankingDealers.co.za  This test is not yet approved or  cleared by the [place] FDA and has been authorized for detection and/or diagnosis of SARS-CoV-2 by FDA under an Emergency Use Authorization (EUA).  This EUA will remain in effect (meaning this test can be used) for the duration of the COVID-19 declaration under Section 564(b)(1) of the Act, 21 U.S.C. section 360bbb-3(b)(1), unless the authorization is terminated or revoked sooner.  Performed at [facility], [place] [address] [place], [place] [address]     Radiology  Reports DG Chest 2 View  Result Date: 06/28/2020 CLINICAL DATA:  Recent fall with chest pain, initial encounter EXAM: CHEST - 2 VIEW COMPARISON:  02/25/2018 FINDINGS: Cardiac shadow is within normal limits. The lungs are well aerated bilaterally. Some scarring is noted in the upper lobes bilaterally. No sizable effusion is seen. No acute bony abnormality is noted. IMPRESSION: No acute abnormality noted. Electronically Signed   By: Inez Catalina M.D.   On: 06/28/2020 02:12   CT Head Wo Contrast  Result Date: 06/28/2020 CLINICAL DATA:  Fall, laceration the high left ear EXAM: CT HEAD WITHOUT CONTRAST TECHNIQUE: Contiguous axial images were obtained from the base of the skull through the vertex without intravenous contrast. COMPARISON:  None. FINDINGS: Brain: No evidence of acute territorial infarction, hemorrhage, hydrocephalus,extra-axial collection or mass lesion/mass effect. There is dilatation the ventricles and sulci consistent with age-related atrophy. Low-attenuation changes in the deep white matter consistent with small vessel ischemia. Vascular: No hyperdense vessel or unexpected calcification. Skull: The skull is intact. No fracture or focal lesion identified. Sinuses/Orbits: The visualized paranasal sinuses and mastoid air cells are clear. The orbits and globes intact. Other: None Cervical spine: Alignment: Physiologic Skull base and vertebrae: Visualized skull base is intact. No atlanto-occipital dissociation. The vertebral body heights are well maintained. No fracture or pathologic osseous lesion seen. Soft tissues and spinal canal: The visualized paraspinal soft tissues are unremarkable. No prevertebral soft tissue swelling is seen. The spinal canal is grossly unremarkable, no large epidural collection or significant canal narrowing. Disc levels: Cervical spine spondylosis is seen with disc height loss, disc osteophyte complex and uncovertebral osteophytes most notable at C4-C5 with moderate neural  foraminal narrowing and mild central canal stenosis. Upper chest: Biapical scarring is noted. Thoracic inlet is within normal limits. Other: None IMPRESSION: No acute intracranial abnormality. Findings consistent with age related atrophy and chronic small vessel ischemia No acute fracture or malalignment of the spine. Electronically Signed   By: Prudencio Pair M.D.   On: 06/28/2020 02:15   CT Cervical Spine Wo Contrast  Result Date: 06/28/2020 CLINICAL DATA:  Fall, laceration the high left ear EXAM: CT HEAD WITHOUT CONTRAST TECHNIQUE: Contiguous axial images were obtained from the base of the skull through the vertex without intravenous contrast. COMPARISON:  None. FINDINGS: Brain: No evidence of acute territorial infarction, hemorrhage, hydrocephalus,extra-axial collection or mass lesion/mass effect. There is dilatation the ventricles and sulci consistent with age-related atrophy. Low-attenuation changes in the deep white matter consistent with small vessel ischemia. Vascular: No hyperdense vessel or unexpected calcification. Skull: The skull is intact. No fracture or focal lesion identified. Sinuses/Orbits: The visualized paranasal sinuses and mastoid air cells are clear. The orbits and globes intact. Other: None Cervical spine: Alignment: Physiologic Skull base and vertebrae: Visualized skull base is intact. No atlanto-occipital dissociation. The vertebral body heights are well maintained. No fracture or pathologic osseous lesion seen. Soft tissues and spinal canal: The visualized paraspinal soft tissues are unremarkable. No prevertebral soft tissue swelling is seen. The spinal canal is grossly unremarkable, no large epidural collection or significant canal narrowing. Disc levels: Cervical spine spondylosis is seen with disc height loss, disc osteophyte complex and uncovertebral osteophytes most notable at C4-C5 with moderate neural foraminal narrowing and mild central canal stenosis. Upper chest: Biapical  scarring is noted. Thoracic inlet is within normal limits. Other: None IMPRESSION: No acute intracranial abnormality. Findings consistent with age related atrophy and chronic small vessel ischemia No acute fracture or malalignment of the spine. Electronically Signed   By: Prudencio Pair M.D.   On: 06/28/2020 02:15   DG Shoulder Left  Result Date: 06/28/2020 CLINICAL DATA:  Recent fall in bathtub with left shoulder pain, initial encounter EXAM: LEFT SHOULDER - 2+ VIEW COMPARISON:  None. FINDINGS: There is no evidence of fracture or dislocation. There is no evidence of arthropathy or other focal bone abnormality. Soft tissues are unremarkable. IMPRESSION: Choose 1 Electronically Signed   By: Inez Catalina M.D.   On: 06/28/2020 02:13   DG Hip Unilat W or Wo Pelvis 2-3 Views Left  Result Date: 06/28/2020 CLINICAL DATA:  Fall, left hip pain EXAM: DG HIP (WITH OR WITHOUT PELVIS) 2-3V LEFT COMPARISON:  None. FINDINGS: There is no evidence of hip fracture or dislocation. There is diffuse osteopenia. Moderate bilateral hip osteoarthritis is seen with superior joint space loss. Scattered vascular calcifications are noted. IMPRESSION: No acute osseous abnormality. Electronically Signed   By: Prudencio Pair M.D.   On: 06/28/2020 02:12    Lab Data:  CBC: Recent Labs  Lab 06/28/20 0123  WBC 10.5  NEUTROABS 8.7*  HGB 12.0*  HCT 37.4*  MCV 99.5  PLT 858   Basic Metabolic Panel: Recent Labs  Lab 06/28/20 0123 06/28/20 0737 06/28/20 1105  NA 125* 128* 131*  K 4.2 3.3* 3.9  CL 94* 99 96*  CO2 23 22 25   GLUCOSE 85 86 100*  BUN 13 11 12   CREATININE 0.76 0.57* 0.76  CALCIUM 8.6* 7.3* 8.3*   GFR: Estimated Creatinine Clearance: 68.9 mL/min (by C-G formula based on SCr of 0.76 mg/dL). Liver Function Tests: No results for input(s): AST, ALT, ALKPHOS, BILITOT, PROT, ALBUMIN in the last 168 hours. No results for input(s): LIPASE, AMYLASE in the last 168 hours. No results for input(s): AMMONIA in the last  168 hours. Coagulation Profile: No results for input(s): INR, PROTIME in the last 168 hours. Cardiac Enzymes: No results for input(s): CKTOTAL, CKMB, CKMBINDEX, TROPONINI in the last 168 hours. BNP (last 3 results) No results for input(s): PROBNP in the last 8760 hours.  HbA1C: No results for input(s): HGBA1C in the last 72 hours. CBG: No results for input(s): GLUCAP in the last 168 hours. Lipid Profile: No results for input(s): CHOL, HDL, LDLCALC, TRIG, CHOLHDL, LDLDIRECT in the last 72 hours. Thyroid Function Tests: No results for input(s): TSH, T4TOTAL, FREET4, T3FREE, THYROIDAB in the last 72 hours. Anemia Panel: No results for input(s): VITAMINB12, FOLATE, FERRITIN, TIBC, IRON, RETICCTPCT in the last 72 hours. Urine analysis: No results found for: COLORURINE, APPEARANCEUR, LABSPEC, PHURINE, GLUCOSEU, HGBUR, BILIRUBINUR, KETONESUR, PROTEINUR, UROBILINOGEN, NITRITE, LEUKOCYTESUR   Kansas Spainhower M.D. [facility] 06/28/2020, 11:48 AM   Call night coverage person covering after 7pm

## 2020-06-28 NOTE — ED Notes (Signed)
Patient transported to X-ray 

## 2020-06-28 NOTE — Evaluation (Signed)
Occupational Therapy Evaluation Patient Details Name: Patrick Barnes MRN: [id number] DOB: 07-Jan-1951 Today's Date: 06/28/2020    History of Present Illness Patient is a 69 year old male with medical history significant of anxiety, bradycardia, PVCs and VT status post ablation, CAD status post PCI in 2013, ischemic dilated cardiomyopathy (EF 40-45 %), hyperlipidemia, hypogonadotropic hypogonadism admitted to ED after fall x2 yesterday with L shoulder, L hip and head pain. All XR negative for acute fracture.    Clinical Impression   Patient modified independent for bed mobility, donning underwear (other than assist threading catheter bag) and ambulating to bathroom holding IV pole. Patient denies any questions/concerns for OT, patient appears at baseline. Will discontinue acute OT services at this time.     Follow Up Recommendations  No OT follow up    Equipment Recommendations  None recommended by OT       Precautions / Restrictions Precautions Precautions: Fall Restrictions Weight Bearing Restrictions: No      Mobility Bed Mobility Overal bed mobility: Modified Independent                Transfers Overall transfer level: Modified independent Equipment used:  (IV pole)             General transfer comment: patient able to stand from bed and transfer to regular toilet without physical assistance, held onto IV pole     Balance Overall balance assessment: Mild deficits observed, not formally tested                                         ADL either performed or assessed with clinical judgement   ADL Overall ADL's : Modified independent                                       General ADL Comments: patient hold onto IV pole to ambulate to bathroom, able to don underwear long sitting in bed                   Pertinent Vitals/Pain Pain Assessment: 0-10 Pain Score: 4  Pain Location: L hip Pain Descriptors / Indicators:  Sore Pain Intervention(s): Monitored during session     Hand Dominance Right   Extremity/Trunk Assessment Upper Extremity Assessment Upper Extremity Assessment: Overall WFL for tasks assessed   Lower Extremity Assessment Lower Extremity Assessment: Defer to PT evaluation       Communication Communication Communication: No difficulties   Cognition Arousal/Alertness: Awake/alert Behavior During Therapy: WFL for tasks assessed/performed Overall Cognitive Status: Within Functional Limits for tasks assessed                                     General Comments  BP semi-supine 109/78, standing 101/72 with no report of dizziness            Home Living Family/patient expects to be discharged to:: Private residence Living Arrangements: Spouse/significant other Available Help at Discharge: Family Type of Home: House       Home Layout: Two level;Bed/bath upstairs     Bathroom Shower/Tub: Tub/shower unit;Walk-in shower   Bathroom Toilet: Standard     Home Equipment: None          Prior Functioning/Environment Level of Independence:  Independent        Comments: patient reports he exercises regularly        OT Problem List: Pain         OT Goals(Current goals can be found in the care plan section) Acute Rehab OT Goals Patient Stated Goal: go home OT Goal Formulation: With patient   AM-PAC OT "6 Clicks" Daily Activity     Outcome Measure Help from another person eating meals?: None Help from another person taking care of personal grooming?: None Help from another person toileting, which includes using toliet, bedpan, or urinal?: None Help from another person bathing (including washing, rinsing, drying)?: None Help from another person to put on and taking off regular upper body clothing?: None Help from another person to put on and taking off regular lower body clothing?: None 6 Click Score: 24   End of Session Equipment Utilized During  Treatment: Other (comment) (IV pole) Nurse Communication: Mobility status;Other (comment) (pt requesting to sit on toilet for "a while")  Activity Tolerance: Patient tolerated treatment well Patient left: Other (comment) (on toilet, RN aware)  OT Visit Diagnosis: Pain Pain - Right/Left: Left Pain - part of body: Hip                Time: 7445-1460 OT Time Calculation (min): 20 min Charges:  OT General Charges $OT Visit: 1 Visit OT Evaluation $OT Eval Low Complexity: [place] OT Pager: [phone]   Rosemary Holms 06/28/2020, 10:19 AM

## 2020-06-28 NOTE — ED Notes (Signed)
Pt cont with asymptomatic bradycardia. Resting comfortably in stretcher, wakes easily to verbal stimuli. Offers no complaints.

## 2020-06-28 NOTE — Consult Note (Addendum)
Cardiology Consultation:   Patient ID: Patrick Barnes; [id number]; 06-03-1951   Admit date: 06/28/2020 Date of Consult: 06/28/2020  Primary Care Provider: Leighton Ruff, MD Primary Cardiologist: Dr. Radford Pax, MD   Primary Electrophysiologist: Dr. Rayann Heman, MD   Patient Profile:   Patrick Barnes is a 69 y.o. male with a hx of ASCAD s/p PCI of LAD 2013, RVOT PVCs and VT s/p ablation, ischemic DCM EF45-50%and dyslipidemia who is being seen today for the evaluation of bradycardia at the request of Dr. Marlowe Sax.  History of Present Illness:   Patrick Barnes is a 69yo M with a hx as stated above who presented to [facility] on 06/28/20 after two falls which occurred yesterday. Pt reports that the initial fall occurred while he was in his kitchen cooking on the stove.  He states he walked away for a brief moment and tripped on a small vacuum cleaner at which time he hit his shoulder and bumped his head.  He went on about his day as he was feeling fine.  He went to bed in his usual state of health and woke up to use the restroom approximately 11 PM last night.  He has been having ongoing issues with BPH more specifically starting his stream.  He states he was straining to urinate and unfortunately, fell and hit his head on the tub causing a laceration.  Given this, his wife brought him to the ED for further evaluation.  He denies chest pain, palpitations, dizziness, shortness of breath, LE edema or orthostatic symptoms.  In the ED, he was found to be bradycardic on telemetry with an EKG showing known RBBB and a rate at 45 bpm.  Telemetry shows persistent bradycardia with rates mainly in the mid 40s however there are dips to the 39 bpm range. BP has been stable. hsT normal. CXR with no acute cardiopulmonary disease. Imaging performed on arrival given falls with no acute changes.  He received 1 staple in the posterior auricular region for small laceration.  Orthostatics as follows: Lying-130/75 with HR 44 bpm;  sitting-122/76 with HR 41 bpm.  No standing report noted  He is followed by Dr. Radford Pax for his cardiology care in regards to CAD and DCM. He is also followed by Dr. Rayann Heman for EP given hx of VT and PVCs with ablation. He was last seen in follow up with Lyda Jester, PA on 02/07/2019 at which time he was noted to be bradycardic with a rate on EKG at 56bpm. He was asymptomatic.   Past Medical History:  Diagnosis Date  . Anxiety   . Bradycardia 07/23/2016  . CAD (coronary artery disease) 01/2012   cardiac cath...with 80% stenosis in proximal LAD with PCI     . Dilated cardiomyopathy ([place])    Echo 11/2012 EF 45-50%  . Dyslipidemia   . Elevated CPK    Proboly due to excessive exercise.  . Elevated LFTs    history of-Now resolved  . H/O vasectomy   . Hx of tonsillectomy   . Hypogonadotropic hypogonadism (HCC)    Dr. Buddy Duty and Dr Jeffie Pollock  . Inguinal hernia   . Low bone density for age    endo is following  . Low testosterone   . PVC's (premature ventricular contractions)    s/p ablation Dr Rayann Heman and Dr Radford Pax  . RBBB   . Vitamin D deficiency     Past Surgical History:  Procedure Laterality Date  . CARDIAC CATHETERIZATION     w 80% stenosis in proimal  LAD w PCI on 2/13.  [name] Kitchen COLONOSCOPY  10/2005   had internal nonbleeding small hemorrhoids,repeat in 10 years.  . CYSTECTOMY    . EP study and ablation for RVOT PVCs  08/12/12  . HERNIA REPAIR     Inguinal  . PERCUTANEOUS CORONARY STENT INTERVENTION (PCI-S) Bilateral 01/13/2012   Procedure: PERCUTANEOUS CORONARY STENT INTERVENTION (PCI-S);  Surgeon: Jettie Booze, MD;  Location: [facility] CATH LAB;  Service: Cardiovascular;  Laterality: Bilateral;  possible radial artery  . TONSILLECTOMY    . V-TACH ABLATION N/A 08/11/2012   Procedure: V-TACH ABLATION;  Surgeon: Thompson Grayer, MD;  Location: [facility] CATH LAB;  Service: Cardiovascular;  Laterality: N/A;  . VASECTOMY       Prior to Admission medications   Medication Sig Start Date End Date  Taking? Authorizing Provider  acetaminophen (TYLENOL) 500 MG tablet Take 1,000 mg by mouth daily as needed. For sinus headaches   Yes [provider]  BABY ASPIRIN PO Take 81 mg by mouth daily.   Yes [provider]  cholecalciferol (VITAMIN D3) 25 MCG (1000 UT) tablet Take 1,000 Units by mouth daily.   Yes [provider]  DENTA 5000 PLUS 1.1 % CREA dental cream Place 1 application onto teeth at bedtime.  12/06/13  Yes [provider]  Melatonin 3 MG CAPS Take 3 mg by mouth daily as needed (sleep).    Yes [provider]  MULTIPLE VITAMIN PO Take 1 tablet by mouth every other day.    Yes [provider]  nitroGLYCERIN (NITROSTAT) 0.4 MG SL tablet PLACE 1 TABLET UNDER TONGUE EVERY 5 MINUTES AS NEEDED FOR CHEST PAIN.[name] Kitchen MAXIMUM OF 3 DOSES 05/23/18  Yes Turner, Traci R, MD  ramipril (ALTACE) 2.5 MG capsule Take 2.5 mg by mouth daily.  12/09/18  Yes [provider]  sertraline (ZOLOFT) 50 MG tablet Take 75 mg by mouth daily.    Yes [provider]  simvastatin (ZOCOR) 40 MG tablet Take 40 mg by mouth daily.   Yes [provider]  tamsulosin (FLOMAX) 0.4 MG CAPS capsule Take 0.4 mg by mouth daily. 06/19/20  Yes [provider]  Testosterone (FORTESTA) 10 MG/ACT (2%) GEL Place 4 Squirts onto the skin daily.    Yes [provider]    Inpatient Medications: Scheduled Meds: . aspirin EC  81 mg Oral Daily  . enoxaparin (LOVENOX) injection  40 mg Subcutaneous Q24H  . finasteride  5 mg Oral Daily  . ramipril  2.5 mg Oral Daily  . simvastatin  40 mg Oral Daily  . Testosterone  4 Squirt Transdermal Daily   Continuous Infusions: . sodium chloride 125 mL/hr at 06/28/20 0830   PRN Meds: acetaminophen **OR** acetaminophen  Allergies:    Allergies  Allergen Reactions  . Quinolones     Prolonged QT    Social History:   Social History   Socioeconomic History  . Marital status: Married    Spouse name: Not  on file  . Number of children: Not on file  . Years of education: Not on file  . Highest education level: Not on file  Occupational History  . Occupation: retired  Tobacco Use  . Smoking status: Never Smoker  . Smokeless tobacco: Never Used  Vaping Use  . Vaping Use: Never used  Substance and Sexual Activity  . Alcohol use: Yes    Comment: occasional  . Drug use: No  . Sexual activity: Not Currently  Other Topics Concern  . Not on  file  Social History Narrative  . Not on file   Social Determinants of Health   Financial Resource Strain:   . Difficulty of Paying Living Expenses:   Food Insecurity:   . Worried About Charity fundraiser in the Last Year:   . Arboriculturist in the Last Year:   Transportation Needs:   . Film/video editor (Medical):   [name] Kitchen Lack of Transportation (Non-Medical):   Physical Activity:   . Days of Exercise per Week:   . Minutes of Exercise per Session:   Stress:   . Feeling of Stress :   Social Connections:   . Frequency of Communication with Friends and Family:   . Frequency of Social Gatherings with Friends and Family:   . Attends Religious Services:   . Active Member of Clubs or Organizations:   . Attends Archivist Meetings:   [name] Kitchen Marital Status:   Intimate Partner Violence:   . Fear of Current or Ex-Partner:   . Emotionally Abused:   [name] Kitchen Physically Abused:   . Sexually Abused:     Family History:   Family History  Problem Relation Age of Onset  . Cancer Mother        died at 20yo   Family Status:  Family Status  Relation Name Status  . Mother  Deceased  . Father  Deceased       Accidental death  . MGM  Deceased  . MGF  Deceased  . PGM  Deceased  . PGF  Deceased    ROS:  Please see the history of present illness.  All other ROS reviewed and negative.     Physical Exam/Data:   Vitals:   06/28/20 0803 06/28/20 0817 06/28/20 0830 06/28/20 0900  BP: 114/65  109/77 (!) 133/74  Pulse: 50 48 (!) 43 (!) 42  Resp:  (!) 11 15 17 12   Temp:      TempSrc:      SpO2: 94% 94% 100% 100%  Weight:      Height:        Intake/Output Summary (Last 24 hours) at 06/28/2020 0916 Last data filed at 06/28/2020 0830 Gross per 24 hour  Intake 260 ml  Output 1500 ml  Net -1240 ml   Filed Weights   06/27/20 2354  Weight: 56.7 kg   Body mass index is 20.18 kg/m.   General: Well developed, well nourished, NAD Neck: Negative for carotid bruits. No JVD Lungs:Clear to ausculation bilaterally. No wheezes, rales, or rhonchi. Breathing is unlabored. Cardiovascular: RRR with S1 S2. No murmurs Abdomen: Soft, non-tender, non-distended. No obvious abdominal masses. Extremities: No edema.  Radial pulses 2+ bilaterally Neuro: Alert and oriented. No focal deficits. No facial asymmetry. MAE spontaneously. Psych: Responds to questions appropriately with normal affect.     EKG:  The EKG was personally reviewed and demonstrates: 06/28/2020 sinus bradycardia with known RBBB, no acute changes and HR 45 bpm Telemetry:  Telemetry was personally reviewed and demonstrates: 06/28/2020 sinus bradycardia with rates mostly in the mid 40s, asymptomatic  Relevant CV Studies:  Gated Myoview perfusion study 02/08/2018:   The patient walked for a total of 8 minutes on a Bruce protocol treadmill test. He had a right bundle branch block at baseline. He achieved a peak heart rate of 109 which is only 71% predicted maximal heart rate. The patient was changed to a Lexiscan protocol.  This is a low risk study. There is no evidence of ischemia.  The left  ventricle is mildly dilated. Nuclear stress EF: 43%. The left ventricular ejection fraction is mildly decreased (45-54%).   Echocardiogram 02/08/2018:   - Left ventricle: The cavity size was normal. Wall thickness was  normal. Systolic function was mildly to moderately reduced. The  estimated ejection fraction was in the range of 40% to 45%.  Diffuse hypokinesis. Features are  consistent with a pseudonormal  left ventricular filling pattern, with concomitant abnormal  relaxation and increased filling pressure (grade 2 diastolic  dysfunction).  - Aortic valve: There was trivial regurgitation.  - Left atrium: The atrium was mildly dilated.  - Right ventricle: The cavity size was moderately dilated.  - Right atrium: The atrium was severely dilated.   Laboratory Data:  Chemistry Recent Labs  Lab 06/28/20 0123 06/28/20 0737  NA 125* 128*  K 4.2 3.3*  CL 94* 99  CO2 23 22  GLUCOSE 85 86  BUN 13 11  CREATININE 0.76 0.57*  CALCIUM 8.6* 7.3*  GFRNONAA >60 >60  GFRAA >60 >60  ANIONGAP 8 7    ALT  Date Value Ref Range Status  10/16/2014 19 0 - 53 U/L Final   Hematology Recent Labs  Lab 06/28/20 0123  WBC 10.5  RBC 3.76*  HGB 12.0*  HCT 37.4*  MCV 99.5  MCH 31.9  MCHC 32.1  RDW 13.1  PLT 206   Cardiac EnzymesNo results for input(s): TROPONINI in the last 168 hours. No results for input(s): TROPIPOC in the last 168 hours.  BNPNo results for input(s): BNP, PROBNP in the last 168 hours.  DDimer No results for input(s): DDIMER in the last 168 hours. TSH: No results found for: TSH Lipids: Lab Results  Component Value Date   CHOL 137 10/16/2014   HDL 60.90 10/16/2014   LDLCALC 67 10/16/2014   TRIG 46.0 10/16/2014   CHOLHDL 2 10/16/2014   HgbA1c: Lab Results  Component Value Date   HGBA1C 5.6 01/13/2012    Radiology/Studies:  DG Chest 2 View  Result Date: 06/28/2020 CLINICAL DATA:  Recent fall with chest pain, initial encounter EXAM: CHEST - 2 VIEW COMPARISON:  02/25/2018 FINDINGS: Cardiac shadow is within normal limits. The lungs are well aerated bilaterally. Some scarring is noted in the upper lobes bilaterally. No sizable effusion is seen. No acute bony abnormality is noted. IMPRESSION: No acute abnormality noted. Electronically Signed   By: Inez Catalina M.D.   On: 06/28/2020 02:12   CT Head Wo Contrast  Result Date:  06/28/2020 CLINICAL DATA:  Fall, laceration the high left ear EXAM: CT HEAD WITHOUT CONTRAST TECHNIQUE: Contiguous axial images were obtained from the base of the skull through the vertex without intravenous contrast. COMPARISON:  None. FINDINGS: Brain: No evidence of acute territorial infarction, hemorrhage, hydrocephalus,extra-axial collection or mass lesion/mass effect. There is dilatation the ventricles and sulci consistent with age-related atrophy. Low-attenuation changes in the deep white matter consistent with small vessel ischemia. Vascular: No hyperdense vessel or unexpected calcification. Skull: The skull is intact. No fracture or focal lesion identified. Sinuses/Orbits: The visualized paranasal sinuses and mastoid air cells are clear. The orbits and globes intact. Other: None Cervical spine: Alignment: Physiologic Skull base and vertebrae: Visualized skull base is intact. No atlanto-occipital dissociation. The vertebral body heights are well maintained. No fracture or pathologic osseous lesion seen. Soft tissues and spinal canal: The visualized paraspinal soft tissues are unremarkable. No prevertebral soft tissue swelling is seen. The spinal canal is grossly unremarkable, no large epidural collection or significant canal narrowing. Disc levels:  Cervical spine spondylosis is seen with disc height loss, disc osteophyte complex and uncovertebral osteophytes most notable at C4-C5 with moderate neural foraminal narrowing and mild central canal stenosis. Upper chest: Biapical scarring is noted. Thoracic inlet is within normal limits. Other: None IMPRESSION: No acute intracranial abnormality. Findings consistent with age related atrophy and chronic small vessel ischemia No acute fracture or malalignment of the spine. Electronically Signed   By: Prudencio Pair M.D.   On: 06/28/2020 02:15   CT Cervical Spine Wo Contrast  Result Date: 06/28/2020 CLINICAL DATA:  Fall, laceration the high left ear EXAM: CT HEAD  WITHOUT CONTRAST TECHNIQUE: Contiguous axial images were obtained from the base of the skull through the vertex without intravenous contrast. COMPARISON:  None. FINDINGS: Brain: No evidence of acute territorial infarction, hemorrhage, hydrocephalus,extra-axial collection or mass lesion/mass effect. There is dilatation the ventricles and sulci consistent with age-related atrophy. Low-attenuation changes in the deep white matter consistent with small vessel ischemia. Vascular: No hyperdense vessel or unexpected calcification. Skull: The skull is intact. No fracture or focal lesion identified. Sinuses/Orbits: The visualized paranasal sinuses and mastoid air cells are clear. The orbits and globes intact. Other: None Cervical spine: Alignment: Physiologic Skull base and vertebrae: Visualized skull base is intact. No atlanto-occipital dissociation. The vertebral body heights are well maintained. No fracture or pathologic osseous lesion seen. Soft tissues and spinal canal: The visualized paraspinal soft tissues are unremarkable. No prevertebral soft tissue swelling is seen. The spinal canal is grossly unremarkable, no large epidural collection or significant canal narrowing. Disc levels: Cervical spine spondylosis is seen with disc height loss, disc osteophyte complex and uncovertebral osteophytes most notable at C4-C5 with moderate neural foraminal narrowing and mild central canal stenosis. Upper chest: Biapical scarring is noted. Thoracic inlet is within normal limits. Other: None IMPRESSION: No acute intracranial abnormality. Findings consistent with age related atrophy and chronic small vessel ischemia No acute fracture or malalignment of the spine. Electronically Signed   By: Prudencio Pair M.D.   On: 06/28/2020 02:15   DG Shoulder Left  Result Date: 06/28/2020 CLINICAL DATA:  Recent fall in bathtub with left shoulder pain, initial encounter EXAM: LEFT SHOULDER - 2+ VIEW COMPARISON:  None. FINDINGS: There is no  evidence of fracture or dislocation. There is no evidence of arthropathy or other focal bone abnormality. Soft tissues are unremarkable. IMPRESSION: Choose 1 Electronically Signed   By: Inez Catalina M.D.   On: 06/28/2020 02:13   DG Hip Unilat W or Wo Pelvis 2-3 Views Left  Result Date: 06/28/2020 CLINICAL DATA:  Fall, left hip pain EXAM: DG HIP (WITH OR WITHOUT PELVIS) 2-3V LEFT COMPARISON:  None. FINDINGS: There is no evidence of hip fracture or dislocation. There is diffuse osteopenia. Moderate bilateral hip osteoarthritis is seen with superior joint space loss. Scattered vascular calcifications are noted. IMPRESSION: No acute osseous abnormality. Electronically Signed   By: Prudencio Pair M.D.   On: 06/28/2020 02:12    Assessment and Plan:   1. Bradycardia with syncope likely secondary to vasovagal syndrome: -Pt presented [facility] on 06/28/20 after two falls which occurred yesterday. Pt reports that the initial fall occurred while he was in his kitchen cooking on the stove.  He states he walked away for a brief moment and tripped on a small vacuum cleaner at which time he hit his shoulder and bumped his head.  The second fall occurred after waking to use the restroom approximately 11 PM last night.  He has been having ongoing  issues with BPH, more specifically starting his stream.While straining to urinate, he fell and hit his head on the tub causing a small laceration with stable imaging on ED arrival. He denies prodromal symptoms such as chest pain, palpitations, dizziness, shortness of breath, LE edema or orthostatic symptoms. -In the ED, found to be bradycardic on telemetry with an EKG showing known RBBB and a rate at 45 bpm.   -Telemetry shows persistent bradycardia with rates mainly in the mid 40s however there are dips to the 39 bpm range. -hsT normal. CXR with no acute cardiopulmonary disease. -Orthostatics as follows: Lying-130/75 with HR 44 bpm; sitting-122/76 with HR 41 bpm.  -Not on AV nodal  blocking agents due to baseline bradycardia.  Per notes, patient has been bradycardic in the past.  Office visit from 02/07/2019 EKG with heart rate 56 bpm.  Prior office visit with Dr. Radford Pax 02/01/2018 heart rate noted to be 44 bpm  -No significant metabolic derangements causing bradycardia.  -Continue to watch closely on telemetry>> if plan is for ED discharge, would likely recommend outpatient monitoring -Appears that he has chronic bradycardia not likely the cause of his more recent falls.  Initial fall sounds mechanical in etiology with the second fall likely secondary to vasovagal maneuver. -MD to follow with final recommendation  2. Ischemic dilated cardiomyopathy: -Most recent echocardiogram 01/2018 with LVEF at 40-45% with G2DD, consistent with prior echocardiograms -No fluid volume overload on exam -On PTA Ramipril 5mg  QD  -No BB secondary to baseline bradycardia   3. CAD: -s/p PCI to LAD in 2013 -Underwent low risk stress test 01/2018 with no ischemia  -Denies anginal symptoms -Continue ASA -Has not been on AV blocking agents secondary to baseline bradycardia   4. Hx of PVCs and VT with known RBBB: -s/p ablation per Dr. Rayann Heman -EKG with SB, RBBB and HR 45bpm  -Not on AV nodal blocking agents   5. HLD: -Last LDL, 75 from Dr. Drema Dallas (PCP) 08/2018 -On PTA simvastatin 40mg  QD  6. RBBB: -Initially noted during OV in 2019. Pt has been asymptomatic  -Stress test 01/2018 with no ischemia or infarct  -Most recent echo with LV dysfunction however similar to prior studies   7. Hyponatremia: -Na+, 125 with unclear etiology  -Denies dehydration with excessive sweating, diarrhea or vomiting -Mentating well -IVF hydrated with 526ml fluid bolus  -Management per primary team    For questions or updates, please contact [place] Please consult [email or web address] for contact info under Cardiology/STEMI.   Lyndel Safe NP-C [facility] Pager: [phone] 06/28/2020 9:16  AM   Attending Note:   The patient was seen and examined.  Agree with assessment and plan as noted above.  Changes made to the above note as needed.  Patient seen and independently examined with  Kathyrn Drown, NP .   We discussed all aspects of the encounter. I agree with the assessment and plan as stated above.  1.    Syncope:   Patrick Barnes had an episode of micturition syncope.  He has been having difficulty voiding and strained last night to urinate - leading to his syncopal episode.    His HR is slow at baseline - and is unchanged from his last office visit with Dr. Radford Pax. He has a follow up appt with Dr. Radford Pax in Aug. 2021.  2.  CAD  :  Denies any angina   3.   Chronic combined systolic / diastolic CHF:   EF is 81-44%.   Cont altace,  Not  on beta blockers due to bradycardia  4.  Hx of VT - s/p ablation:   Will follow up with Dr. Rayann Heman.      I have spent a total of 40 minutes with patient reviewing hospital  notes , telemetry, EKGs, labs and examining patient as well as establishing an assessment and plan that was discussed with the patient. > 50% of time was spent in direct patient care.    Thayer Headings, Brooke Bonito., MD, [facility] 06/28/2020, 12:29 PM [address]  [place] Pager [phone]

## 2020-07-04 DIAGNOSIS — R55 Syncope and collapse: Secondary | ICD-10-CM | POA: Diagnosis not present

## 2020-07-04 DIAGNOSIS — R001 Bradycardia, unspecified: Secondary | ICD-10-CM | POA: Diagnosis not present

## 2020-07-04 DIAGNOSIS — E871 Hypo-osmolality and hyponatremia: Secondary | ICD-10-CM | POA: Diagnosis not present

## 2020-07-04 DIAGNOSIS — F339 Major depressive disorder, recurrent, unspecified: Secondary | ICD-10-CM | POA: Diagnosis not present

## 2020-07-04 DIAGNOSIS — N400 Enlarged prostate without lower urinary tract symptoms: Secondary | ICD-10-CM | POA: Diagnosis not present

## 2020-07-04 DIAGNOSIS — Z4802 Encounter for removal of sutures: Secondary | ICD-10-CM | POA: Diagnosis not present

## 2020-07-04 DIAGNOSIS — F411 Generalized anxiety disorder: Secondary | ICD-10-CM | POA: Diagnosis not present

## 2020-07-09 DIAGNOSIS — E871 Hypo-osmolality and hyponatremia: Secondary | ICD-10-CM | POA: Diagnosis not present

## 2020-07-11 DIAGNOSIS — L84 Corns and callosities: Secondary | ICD-10-CM | POA: Diagnosis not present

## 2020-07-11 DIAGNOSIS — D1801 Hemangioma of skin and subcutaneous tissue: Secondary | ICD-10-CM | POA: Diagnosis not present

## 2020-07-11 DIAGNOSIS — L821 Other seborrheic keratosis: Secondary | ICD-10-CM | POA: Diagnosis not present

## 2020-07-11 DIAGNOSIS — L814 Other melanin hyperpigmentation: Secondary | ICD-10-CM | POA: Diagnosis not present

## 2020-07-11 DIAGNOSIS — D229 Melanocytic nevi, unspecified: Secondary | ICD-10-CM | POA: Diagnosis not present

## 2020-07-11 DIAGNOSIS — L819 Disorder of pigmentation, unspecified: Secondary | ICD-10-CM | POA: Diagnosis not present

## 2020-07-11 DIAGNOSIS — E871 Hypo-osmolality and hyponatremia: Secondary | ICD-10-CM | POA: Diagnosis not present

## 2020-07-17 DIAGNOSIS — Z23 Encounter for immunization: Secondary | ICD-10-CM | POA: Diagnosis not present

## 2020-07-24 DIAGNOSIS — N3941 Urge incontinence: Secondary | ICD-10-CM | POA: Diagnosis not present

## 2020-07-29 DIAGNOSIS — N3941 Urge incontinence: Secondary | ICD-10-CM | POA: Diagnosis not present

## 2020-07-29 DIAGNOSIS — R3915 Urgency of urination: Secondary | ICD-10-CM | POA: Diagnosis not present

## 2020-07-29 DIAGNOSIS — N401 Enlarged prostate with lower urinary tract symptoms: Secondary | ICD-10-CM | POA: Diagnosis not present

## 2020-08-01 DIAGNOSIS — Z79899 Other long term (current) drug therapy: Secondary | ICD-10-CM | POA: Diagnosis not present

## 2020-08-01 DIAGNOSIS — R7309 Other abnormal glucose: Secondary | ICD-10-CM | POA: Diagnosis not present

## 2020-08-01 DIAGNOSIS — E230 Hypopituitarism: Secondary | ICD-10-CM | POA: Diagnosis not present

## 2020-08-01 DIAGNOSIS — E7800 Pure hypercholesterolemia, unspecified: Secondary | ICD-10-CM | POA: Diagnosis not present

## 2020-08-01 NOTE — Progress Notes (Signed)
Cardiology Office Note:    Date:  08/02/2020   ID:  Patrick Barnes, DOB 01/31/1951, MRN [id number]  PCP:  Leighton Ruff, MD  Cardiologist:  Fransico Him, MD    Referring MD: Leighton Ruff, MD   Chief Complaint  Patient presents with  . Coronary Artery Disease  . Hyperlipidemia  . Cardiomyopathy    History of Present Illness:    Patrick Barnes is a 69 y.o. male with a hx of ASCAD s/p PCI of LAD 2013, RVOT PVCs and VT s/p ablation, ischemic DCM EF 45-50%and dyslipidemia. Since I saw him last he had a hospitalization for syncope.  He had a mechanical fall and then had another episode where he went to the bathroom to urintae and has syncope.  He evaluated by Cards and felt to have micturation syncope.  HR was initially in the 30's in ER but normally is bradycardic but no hypotension.  HIs Flomax was stopped.     He is here today for followup and is doing well.  He denies any chest pain or pressure, SOB, DOE, PND, orthopnea, LE edema, palpitations or syncope. He says that if he gets out of bed too fast he will notice some wooziness.  He is compliant with his meds and is tolerating meds with no SE.       Past Medical History:  Diagnosis Date  . Anxiety   . Bradycardia 07/23/2016  . CAD (coronary artery disease) 01/2012   cardiac cath...with 80% stenosis in proximal LAD with PCI     . Dilated cardiomyopathy ([place])    Echo 11/2012 EF 45-50%  . Dyslipidemia   . Elevated CPK    Proboly due to excessive exercise.  . Elevated LFTs    history of-Now resolved  . H/O vasectomy   . Hx of tonsillectomy   . Hypogonadotropic hypogonadism (HCC)    Dr. Buddy Duty and Dr Jeffie Pollock  . Inguinal hernia   . Low bone density for age    endo is following  . Low testosterone   . PVC's (premature ventricular contractions)    s/p ablation Dr Rayann Heman and Dr Radford Pax  . RBBB   . Vitamin D deficiency     Past Surgical History:  Procedure Laterality Date  . CARDIAC CATHETERIZATION     w 80% stenosis in  proimal LAD w PCI on 2/13.  [name] Kitchen COLONOSCOPY  10/2005   had internal nonbleeding small hemorrhoids,repeat in 10 years.  . CYSTECTOMY    . EP study and ablation for RVOT PVCs  08/12/12  . HERNIA REPAIR     Inguinal  . PERCUTANEOUS CORONARY STENT INTERVENTION (PCI-S) Bilateral 01/13/2012   Procedure: PERCUTANEOUS CORONARY STENT INTERVENTION (PCI-S);  Surgeon: Jettie Booze, MD;  Location: [facility] C[facility] LAB;  Service: Cardiovascular;  Laterality: Bilateral;  possible radial artery  . TONSILLECTOMY    . V-TACH ABLATION N/A 08/11/2012   Procedure: V-TACH ABLATION;  Surgeon: Thompson Grayer, MD;  Location: [facility] CATH LAB;  Service: Cardiovascular;  Laterality: N/A;  . VASECTOMY      Current Medications: Current Meds  Medication Sig  . acetaminophen (TYLENOL) 500 MG tablet Take 1,000 mg by mouth daily as needed. For sinus headaches  . BABY ASPIRIN PO Take 81 mg by mouth daily.  . cholecalciferol (VITAMIN D3) 25 MCG (1000 UT) tablet Take 1,000 Units by mouth daily.  . finasteride (PROSCAR) 5 MG tablet Take 1 tablet (5 mg total) by mouth daily.  . Melatonin 3 MG CAPS Take 3 mg by mouth  daily as needed (sleep).   . MULTIPLE VITAMIN PO Take 1 tablet by mouth every other day.   . ramipril (ALTACE) 2.5 MG capsule Take 2.5 mg by mouth daily.   . simvastatin (ZOCOR) 40 MG tablet Take 40 mg by mouth daily.  . Testosterone (FORTESTA) 10 MG/ACT (2%) GEL Place 4 Squirts onto the skin daily.   . [DISCONTINUED] nitroGLYCERIN (NITROSTAT) 0.4 MG SL tablet PLACE 1 TABLET UNDER TONGUE EVERY 5 MINUTES AS NEEDED FOR CHEST PAIN.[name] Kitchen MAXIMUM OF 3 DOSES     Allergies:   Quinolones   Social History   Socioeconomic History  . Marital status: Married    Spouse name: Not on file  . Number of children: Not on file  . Years of education: Not on file  . Highest education level: Not on file  Occupational History  . Occupation: retired  Tobacco Use  . Smoking status: Never Smoker  . Smokeless tobacco: Never Used  Vaping Use    . Vaping Use: Never used  Substance and Sexual Activity  . Alcohol use: Yes    Comment: occasional  . Drug use: No  . Sexual activity: Not Currently  Other Topics Concern  . Not on file  Social History Narrative  . Not on file   Social Determinants of Health   Financial Resource Strain:   . Difficulty of Paying Living Expenses: Not on file  Food Insecurity:   . Worried About Charity fundraiser in the Last Year: Not on file  . Ran Out of Food in the Last Year: Not on file  Transportation Needs:   . Lack of Transportation (Medical): Not on file  . Lack of Transportation (Non-Medical): Not on file  Physical Activity:   . Days of Exercise per Week: Not on file  . Minutes of Exercise per Session: Not on file  Stress:   . Feeling of Stress : Not on file  Social Connections:   . Frequency of Communication with Friends and Family: Not on file  . Frequency of Social Gatherings with Friends and Family: Not on file  . Attends Religious Services: Not on file  . Active Member of Clubs or Organizations: Not on file  . Attends Archivist Meetings: Not on file  . Marital Status: Not on file     Family History: The patient's family history includes Cancer in his mother.  ROS:   Please see the history of present illness.    ROS  All other systems reviewed and negative.   EKGs/Labs/Other Studies Reviewed:    The following studies were reviewed today: none  EKG:  EKG is  ordered today.  The ekg ordered today demonstrates  Recent Labs: 06/28/2020: BUN 12; Creatinine, Ser 0.68; Hemoglobin 12.0; Platelets 206; Potassium 4.2; Sodium 132   Recent Lipid Panel    Component Value Date/Time   CHOL 137 10/16/2014 0740   TRIG 46.0 10/16/2014 0740   HDL 60.90 10/16/2014 0740   CHOLHDL 2 10/16/2014 0740   VLDL 9.2 10/16/2014 0740   LDLCALC 67 10/16/2014 0740    Physical Exam:    VS:  BP 112/82   Pulse (!) 50   Ht 5\' 6"  (1.676 m)   Wt 123 lb 3.2 oz (55.9 kg)   SpO2 99%    BMI 19.89 kg/m     Wt Readings from Last 3 Encounters:  08/02/20 123 lb 3.2 oz (55.9 kg)  06/27/20 125 lb (56.7 kg)  02/07/19 132 lb 6.4 oz (60.1 kg)  GEN: Well nourished, well developed in no acute distress HEENT: Normal NECK: No JVD; No carotid bruits LYMPHATICS: No lymphadenopathy CARDIAC:RRR, no murmurs, rubs, gallops RESPIRATORY:  Clear to auscultation without rales, wheezing or rhonchi  ABDOMEN: Soft, non-tender, non-distended MUSCULOSKELETAL:  No edema; No deformity  SKIN: Warm and dry NEUROLOGIC:  Alert and oriented x 3 PSYCHIATRIC:  Normal affect    ASSESSMENT:    1. Coronary artery disease involving native coronary artery of native heart without angina pectoris   2. Dilated cardiomyopathy ([place])   3. PVC (premature ventricular contraction)   4. Pure hypercholesterolemia   5. RBBB   6. Syncope and collapse    PLAN:    In order of problems listed above:  1.  ASCAD  -s/p PCI of LAD 2013.   -he has not had any anginal symptoms and continues to exercise on his elliptical with no problems -continue ASA and statin  2.  DCM  - EF 45-50% by echo 2019 with diffuse HK and G2DD -he appears euvolemic on exam  3.  PVCs and VT  -s/p ablation  -he has not had any further ventricular tachycardia and has not had any palpitations  4.  Hyperlipidemia  -LDL goal < 70.   -check FLP and ALT -continue simvastatin 40mg  daily  5.  RBBB  - he is asymptomatic and is very active riding his elliptical 17min daily.   -stress  myoview 2019 with no ischemia  6.  Syncope -felt to be micturition induced -he was bradycardic in ER in the 30's but normally runs 40-50's -no further syncope or dizziness -will get a 30 day event monitor to assess heart rate   Medication Adjustments/Labs and Tests Ordered: Current medicines are reviewed at length with the patient today.  Concerns regarding medicines are outlined above.  No orders of the defined types were placed in this  encounter.  Meds ordered this encounter  Medications  . nitroGLYCERIN (NITROSTAT) 0.4 MG SL tablet    Sig: PLACE 1 TABLET UNDER TONGUE EVERY 5 MINUTES AS NEEDED FOR CHEST PAIN.[name] Kitchen MAXIMUM OF 3 DOSES    Dispense:  30 tablet    Refill:  3    Signed, Fransico Him, MD  08/02/2020 8:23 AM    [facility]

## 2020-08-02 ENCOUNTER — Encounter: Payer: Self-pay | Admitting: Cardiology

## 2020-08-02 ENCOUNTER — Other Ambulatory Visit: Payer: Self-pay

## 2020-08-02 ENCOUNTER — Ambulatory Visit (INDEPENDENT_AMBULATORY_CARE_PROVIDER_SITE_OTHER): Payer: Medicare Other | Admitting: Cardiology

## 2020-08-02 ENCOUNTER — Telehealth: Payer: Self-pay | Admitting: Radiology

## 2020-08-02 VITALS — BP 112/82 | HR 50 | Ht 66.0 in | Wt 123.2 lb

## 2020-08-02 DIAGNOSIS — R55 Syncope and collapse: Secondary | ICD-10-CM | POA: Diagnosis not present

## 2020-08-02 DIAGNOSIS — I493 Ventricular premature depolarization: Secondary | ICD-10-CM | POA: Diagnosis not present

## 2020-08-02 DIAGNOSIS — R7309 Other abnormal glucose: Secondary | ICD-10-CM | POA: Diagnosis not present

## 2020-08-02 DIAGNOSIS — F411 Generalized anxiety disorder: Secondary | ICD-10-CM | POA: Diagnosis not present

## 2020-08-02 DIAGNOSIS — I4510 Unspecified right bundle-branch block: Secondary | ICD-10-CM | POA: Diagnosis not present

## 2020-08-02 DIAGNOSIS — I2510 Atherosclerotic heart disease of native coronary artery without angina pectoris: Secondary | ICD-10-CM | POA: Diagnosis not present

## 2020-08-02 DIAGNOSIS — E871 Hypo-osmolality and hyponatremia: Secondary | ICD-10-CM | POA: Diagnosis not present

## 2020-08-02 DIAGNOSIS — E7800 Pure hypercholesterolemia, unspecified: Secondary | ICD-10-CM | POA: Diagnosis not present

## 2020-08-02 DIAGNOSIS — Z5181 Encounter for therapeutic drug level monitoring: Secondary | ICD-10-CM | POA: Diagnosis not present

## 2020-08-02 DIAGNOSIS — E230 Hypopituitarism: Secondary | ICD-10-CM | POA: Diagnosis not present

## 2020-08-02 DIAGNOSIS — I420 Dilated cardiomyopathy: Secondary | ICD-10-CM

## 2020-08-02 MED ORDER — NITROGLYCERIN 0.4 MG SL SUBL: SUBLINGUAL_TABLET | SUBLINGUAL | 3 refills | Status: AC

## 2020-08-02 NOTE — Patient Instructions (Addendum)
Medication Instructions:  Your physician recommends that you continue on your current medications as directed. Please refer to the Current Medication list given to you today.  *If you need a refill on your cardiac medications before your next appointment, please call your pharmacy*  Testing/Procedures: Your physician has recommended that you wear an event monitor. Event monitors are medical devices that record the heart's electrical activity. Doctors most often [place] these monitors to diagnose arrhythmias. Arrhythmias are problems with the speed or rhythm of the heartbeat. The monitor is a small, portable device. You can wear one while you do your normal daily activities. This is usually used to diagnose what is causing palpitations/syncope (passing out).  Follow-Up: At [facility], you and your health needs are our priority.  As part of our continuing mission to provide you with exceptional heart care, we have created designated Provider Care Teams.  These Care Teams include your primary Cardiologist (physician) and Advanced Practice Providers (APPs -  Physician Assistants and Nurse Practitioners) who all work together to provide you with the care you need, when you need it.  Your next appointment:   1 year(s)  The format for your next appointment:   In Person  Provider:   You may see Fransico Him, MD or one of the following Advanced Practice Providers on your designated Care Team:    Melina Copa, PA-C  Ermalinda Barrios, PA-C

## 2020-08-02 NOTE — Telephone Encounter (Signed)
Enrolled patient for a 30 Day [employer] Event Monitor to be mailed to patients home.  

## 2020-08-07 DIAGNOSIS — E871 Hypo-osmolality and hyponatremia: Secondary | ICD-10-CM | POA: Diagnosis not present

## 2020-08-09 ENCOUNTER — Other Ambulatory Visit: Payer: Self-pay | Admitting: Critical Care Medicine

## 2020-08-09 ENCOUNTER — Other Ambulatory Visit: Payer: Medicare Other

## 2020-08-09 DIAGNOSIS — Z20822 Contact with and (suspected) exposure to covid-19: Secondary | ICD-10-CM | POA: Diagnosis not present

## 2020-08-09 DIAGNOSIS — E230 Hypopituitarism: Secondary | ICD-10-CM | POA: Diagnosis not present

## 2020-08-09 DIAGNOSIS — R7309 Other abnormal glucose: Secondary | ICD-10-CM | POA: Diagnosis not present

## 2020-08-09 DIAGNOSIS — M8580 Other specified disorders of bone density and structure, unspecified site: Secondary | ICD-10-CM | POA: Diagnosis not present

## 2020-08-12 LAB — NOVEL CORONAVIRUS, NAA: SARS-CoV-2, NAA: NOT DETECTED

## 2020-08-19 ENCOUNTER — Encounter (INDEPENDENT_AMBULATORY_CARE_PROVIDER_SITE_OTHER): Payer: Medicare Other

## 2020-08-19 ENCOUNTER — Encounter: Payer: Self-pay | Admitting: Cardiology

## 2020-08-19 DIAGNOSIS — R55 Syncope and collapse: Secondary | ICD-10-CM | POA: Diagnosis not present

## 2020-08-19 DIAGNOSIS — Z23 Encounter for immunization: Secondary | ICD-10-CM | POA: Diagnosis not present

## 2020-08-26 ENCOUNTER — Telehealth: Payer: Self-pay | Admitting: Cardiology

## 2020-08-26 NOTE — Telephone Encounter (Signed)
Patrick Barnes is calling stating he has been wearing his monitor for about a week, but took it off yesterday evening due to developing a bad red itching skin rash. He is wanting to know if there is anything that can be done to help with the discomfort, so he can put the monitor back on without iritation. Please advise.

## 2020-08-27 NOTE — Telephone Encounter (Signed)
Message sent to [employer] to ship patient Base and sensitive skin electrodes.  Patient may also try applying a thin coat of Maalox to skin in alternate placement position , prior to applying a strip.

## 2020-09-02 ENCOUNTER — Telehealth: Payer: Self-pay | Admitting: Cardiology

## 2020-09-02 NOTE — Telephone Encounter (Signed)
[employer] called to report first documentation of AFib RVR, 130 bpm, auto triggered at 11:51 am.

## 2020-09-02 NOTE — Telephone Encounter (Signed)
Received monitor report via fax. Not AFib. Reviewed by DOD, Dr. Radford Pax, sinus tachycardia seen, HR 130.  No orders received, continue monitoring

## 2020-09-02 NOTE — Telephone Encounter (Signed)
Esther from [employer] calling with critical EKG results.

## 2020-09-03 DIAGNOSIS — F4323 Adjustment disorder with mixed anxiety and depressed mood: Secondary | ICD-10-CM | POA: Diagnosis not present

## 2020-09-30 DIAGNOSIS — S29012A Strain of muscle and tendon of back wall of thorax, initial encounter: Secondary | ICD-10-CM | POA: Diagnosis not present

## 2020-09-30 DIAGNOSIS — M9905 Segmental and somatic dysfunction of pelvic region: Secondary | ICD-10-CM | POA: Diagnosis not present

## 2020-09-30 DIAGNOSIS — M5136 Other intervertebral disc degeneration, lumbar region: Secondary | ICD-10-CM | POA: Diagnosis not present

## 2020-09-30 DIAGNOSIS — M5137 Other intervertebral disc degeneration, lumbosacral region: Secondary | ICD-10-CM | POA: Diagnosis not present

## 2020-09-30 DIAGNOSIS — M9903 Segmental and somatic dysfunction of lumbar region: Secondary | ICD-10-CM | POA: Diagnosis not present

## 2020-09-30 DIAGNOSIS — M9902 Segmental and somatic dysfunction of thoracic region: Secondary | ICD-10-CM | POA: Diagnosis not present

## 2020-09-30 DIAGNOSIS — S138XXA Sprain of joints and ligaments of other parts of neck, initial encounter: Secondary | ICD-10-CM | POA: Diagnosis not present

## 2020-09-30 DIAGNOSIS — M9901 Segmental and somatic dysfunction of cervical region: Secondary | ICD-10-CM | POA: Diagnosis not present

## 2020-10-01 DIAGNOSIS — M5136 Other intervertebral disc degeneration, lumbar region: Secondary | ICD-10-CM | POA: Diagnosis not present

## 2020-10-01 DIAGNOSIS — M9903 Segmental and somatic dysfunction of lumbar region: Secondary | ICD-10-CM | POA: Diagnosis not present

## 2020-10-01 DIAGNOSIS — M5137 Other intervertebral disc degeneration, lumbosacral region: Secondary | ICD-10-CM | POA: Diagnosis not present

## 2020-10-01 DIAGNOSIS — M9905 Segmental and somatic dysfunction of pelvic region: Secondary | ICD-10-CM | POA: Diagnosis not present

## 2020-10-01 DIAGNOSIS — M9901 Segmental and somatic dysfunction of cervical region: Secondary | ICD-10-CM | POA: Diagnosis not present

## 2020-10-01 DIAGNOSIS — M9902 Segmental and somatic dysfunction of thoracic region: Secondary | ICD-10-CM | POA: Diagnosis not present

## 2020-10-01 DIAGNOSIS — S29012A Strain of muscle and tendon of back wall of thorax, initial encounter: Secondary | ICD-10-CM | POA: Diagnosis not present

## 2020-10-01 DIAGNOSIS — S138XXA Sprain of joints and ligaments of other parts of neck, initial encounter: Secondary | ICD-10-CM | POA: Diagnosis not present

## 2020-10-03 DIAGNOSIS — M9901 Segmental and somatic dysfunction of cervical region: Secondary | ICD-10-CM | POA: Diagnosis not present

## 2020-10-03 DIAGNOSIS — S29012A Strain of muscle and tendon of back wall of thorax, initial encounter: Secondary | ICD-10-CM | POA: Diagnosis not present

## 2020-10-03 DIAGNOSIS — F4323 Adjustment disorder with mixed anxiety and depressed mood: Secondary | ICD-10-CM | POA: Diagnosis not present

## 2020-10-03 DIAGNOSIS — M5137 Other intervertebral disc degeneration, lumbosacral region: Secondary | ICD-10-CM | POA: Diagnosis not present

## 2020-10-03 DIAGNOSIS — M9903 Segmental and somatic dysfunction of lumbar region: Secondary | ICD-10-CM | POA: Diagnosis not present

## 2020-10-03 DIAGNOSIS — M9905 Segmental and somatic dysfunction of pelvic region: Secondary | ICD-10-CM | POA: Diagnosis not present

## 2020-10-03 DIAGNOSIS — M9902 Segmental and somatic dysfunction of thoracic region: Secondary | ICD-10-CM | POA: Diagnosis not present

## 2020-10-03 DIAGNOSIS — S138XXA Sprain of joints and ligaments of other parts of neck, initial encounter: Secondary | ICD-10-CM | POA: Diagnosis not present

## 2020-10-03 DIAGNOSIS — M5136 Other intervertebral disc degeneration, lumbar region: Secondary | ICD-10-CM | POA: Diagnosis not present

## 2020-10-07 DIAGNOSIS — N3941 Urge incontinence: Secondary | ICD-10-CM | POA: Diagnosis not present

## 2020-10-07 DIAGNOSIS — R351 Nocturia: Secondary | ICD-10-CM | POA: Diagnosis not present

## 2020-10-07 DIAGNOSIS — N311 Reflex neuropathic bladder, not elsewhere classified: Secondary | ICD-10-CM | POA: Diagnosis not present

## 2020-10-07 DIAGNOSIS — R972 Elevated prostate specific antigen [PSA]: Secondary | ICD-10-CM | POA: Diagnosis not present

## 2020-10-07 DIAGNOSIS — N401 Enlarged prostate with lower urinary tract symptoms: Secondary | ICD-10-CM | POA: Diagnosis not present

## 2020-10-08 DIAGNOSIS — M5137 Other intervertebral disc degeneration, lumbosacral region: Secondary | ICD-10-CM | POA: Diagnosis not present

## 2020-10-08 DIAGNOSIS — M5136 Other intervertebral disc degeneration, lumbar region: Secondary | ICD-10-CM | POA: Diagnosis not present

## 2020-10-08 DIAGNOSIS — M9903 Segmental and somatic dysfunction of lumbar region: Secondary | ICD-10-CM | POA: Diagnosis not present

## 2020-10-08 DIAGNOSIS — M9905 Segmental and somatic dysfunction of pelvic region: Secondary | ICD-10-CM | POA: Diagnosis not present

## 2020-10-08 DIAGNOSIS — M9901 Segmental and somatic dysfunction of cervical region: Secondary | ICD-10-CM | POA: Diagnosis not present

## 2020-10-08 DIAGNOSIS — M9902 Segmental and somatic dysfunction of thoracic region: Secondary | ICD-10-CM | POA: Diagnosis not present

## 2020-10-08 DIAGNOSIS — S29012A Strain of muscle and tendon of back wall of thorax, initial encounter: Secondary | ICD-10-CM | POA: Diagnosis not present

## 2020-10-08 DIAGNOSIS — S138XXA Sprain of joints and ligaments of other parts of neck, initial encounter: Secondary | ICD-10-CM | POA: Diagnosis not present

## 2020-10-09 ENCOUNTER — Other Ambulatory Visit: Payer: Self-pay | Admitting: Urology

## 2020-10-09 DIAGNOSIS — R9389 Abnormal findings on diagnostic imaging of other specified body structures: Secondary | ICD-10-CM

## 2020-10-10 DIAGNOSIS — S138XXA Sprain of joints and ligaments of other parts of neck, initial encounter: Secondary | ICD-10-CM | POA: Diagnosis not present

## 2020-10-10 DIAGNOSIS — M9902 Segmental and somatic dysfunction of thoracic region: Secondary | ICD-10-CM | POA: Diagnosis not present

## 2020-10-10 DIAGNOSIS — M5137 Other intervertebral disc degeneration, lumbosacral region: Secondary | ICD-10-CM | POA: Diagnosis not present

## 2020-10-10 DIAGNOSIS — M9905 Segmental and somatic dysfunction of pelvic region: Secondary | ICD-10-CM | POA: Diagnosis not present

## 2020-10-10 DIAGNOSIS — M9903 Segmental and somatic dysfunction of lumbar region: Secondary | ICD-10-CM | POA: Diagnosis not present

## 2020-10-10 DIAGNOSIS — S29012A Strain of muscle and tendon of back wall of thorax, initial encounter: Secondary | ICD-10-CM | POA: Diagnosis not present

## 2020-10-10 DIAGNOSIS — M5136 Other intervertebral disc degeneration, lumbar region: Secondary | ICD-10-CM | POA: Diagnosis not present

## 2020-10-10 DIAGNOSIS — M9901 Segmental and somatic dysfunction of cervical region: Secondary | ICD-10-CM | POA: Diagnosis not present

## 2020-10-15 DIAGNOSIS — M9905 Segmental and somatic dysfunction of pelvic region: Secondary | ICD-10-CM | POA: Diagnosis not present

## 2020-10-15 DIAGNOSIS — S138XXA Sprain of joints and ligaments of other parts of neck, initial encounter: Secondary | ICD-10-CM | POA: Diagnosis not present

## 2020-10-15 DIAGNOSIS — S29012A Strain of muscle and tendon of back wall of thorax, initial encounter: Secondary | ICD-10-CM | POA: Diagnosis not present

## 2020-10-15 DIAGNOSIS — M5136 Other intervertebral disc degeneration, lumbar region: Secondary | ICD-10-CM | POA: Diagnosis not present

## 2020-10-15 DIAGNOSIS — M9902 Segmental and somatic dysfunction of thoracic region: Secondary | ICD-10-CM | POA: Diagnosis not present

## 2020-10-15 DIAGNOSIS — M9903 Segmental and somatic dysfunction of lumbar region: Secondary | ICD-10-CM | POA: Diagnosis not present

## 2020-10-15 DIAGNOSIS — M9901 Segmental and somatic dysfunction of cervical region: Secondary | ICD-10-CM | POA: Diagnosis not present

## 2020-10-15 DIAGNOSIS — M5137 Other intervertebral disc degeneration, lumbosacral region: Secondary | ICD-10-CM | POA: Diagnosis not present

## 2020-10-17 DIAGNOSIS — S29012A Strain of muscle and tendon of back wall of thorax, initial encounter: Secondary | ICD-10-CM | POA: Diagnosis not present

## 2020-10-17 DIAGNOSIS — M9901 Segmental and somatic dysfunction of cervical region: Secondary | ICD-10-CM | POA: Diagnosis not present

## 2020-10-17 DIAGNOSIS — M9905 Segmental and somatic dysfunction of pelvic region: Secondary | ICD-10-CM | POA: Diagnosis not present

## 2020-10-17 DIAGNOSIS — M9903 Segmental and somatic dysfunction of lumbar region: Secondary | ICD-10-CM | POA: Diagnosis not present

## 2020-10-17 DIAGNOSIS — M9902 Segmental and somatic dysfunction of thoracic region: Secondary | ICD-10-CM | POA: Diagnosis not present

## 2020-10-17 DIAGNOSIS — S138XXA Sprain of joints and ligaments of other parts of neck, initial encounter: Secondary | ICD-10-CM | POA: Diagnosis not present

## 2020-10-17 DIAGNOSIS — M5136 Other intervertebral disc degeneration, lumbar region: Secondary | ICD-10-CM | POA: Diagnosis not present

## 2020-10-17 DIAGNOSIS — M5137 Other intervertebral disc degeneration, lumbosacral region: Secondary | ICD-10-CM | POA: Diagnosis not present

## 2020-10-22 DIAGNOSIS — S29012A Strain of muscle and tendon of back wall of thorax, initial encounter: Secondary | ICD-10-CM | POA: Diagnosis not present

## 2020-10-22 DIAGNOSIS — M5137 Other intervertebral disc degeneration, lumbosacral region: Secondary | ICD-10-CM | POA: Diagnosis not present

## 2020-10-22 DIAGNOSIS — M5136 Other intervertebral disc degeneration, lumbar region: Secondary | ICD-10-CM | POA: Diagnosis not present

## 2020-10-22 DIAGNOSIS — M9905 Segmental and somatic dysfunction of pelvic region: Secondary | ICD-10-CM | POA: Diagnosis not present

## 2020-10-22 DIAGNOSIS — M9903 Segmental and somatic dysfunction of lumbar region: Secondary | ICD-10-CM | POA: Diagnosis not present

## 2020-10-22 DIAGNOSIS — M9901 Segmental and somatic dysfunction of cervical region: Secondary | ICD-10-CM | POA: Diagnosis not present

## 2020-10-22 DIAGNOSIS — S138XXA Sprain of joints and ligaments of other parts of neck, initial encounter: Secondary | ICD-10-CM | POA: Diagnosis not present

## 2020-10-22 DIAGNOSIS — M9902 Segmental and somatic dysfunction of thoracic region: Secondary | ICD-10-CM | POA: Diagnosis not present

## 2020-10-25 DIAGNOSIS — M9902 Segmental and somatic dysfunction of thoracic region: Secondary | ICD-10-CM | POA: Diagnosis not present

## 2020-10-25 DIAGNOSIS — S138XXA Sprain of joints and ligaments of other parts of neck, initial encounter: Secondary | ICD-10-CM | POA: Diagnosis not present

## 2020-10-25 DIAGNOSIS — M9905 Segmental and somatic dysfunction of pelvic region: Secondary | ICD-10-CM | POA: Diagnosis not present

## 2020-10-25 DIAGNOSIS — M9903 Segmental and somatic dysfunction of lumbar region: Secondary | ICD-10-CM | POA: Diagnosis not present

## 2020-10-25 DIAGNOSIS — M9901 Segmental and somatic dysfunction of cervical region: Secondary | ICD-10-CM | POA: Diagnosis not present

## 2020-10-25 DIAGNOSIS — M5137 Other intervertebral disc degeneration, lumbosacral region: Secondary | ICD-10-CM | POA: Diagnosis not present

## 2020-10-25 DIAGNOSIS — M5136 Other intervertebral disc degeneration, lumbar region: Secondary | ICD-10-CM | POA: Diagnosis not present

## 2020-10-25 DIAGNOSIS — S29012A Strain of muscle and tendon of back wall of thorax, initial encounter: Secondary | ICD-10-CM | POA: Diagnosis not present

## 2020-10-28 DIAGNOSIS — I2510 Atherosclerotic heart disease of native coronary artery without angina pectoris: Secondary | ICD-10-CM | POA: Diagnosis not present

## 2020-10-28 DIAGNOSIS — E7800 Pure hypercholesterolemia, unspecified: Secondary | ICD-10-CM | POA: Diagnosis not present

## 2020-10-28 DIAGNOSIS — N400 Enlarged prostate without lower urinary tract symptoms: Secondary | ICD-10-CM | POA: Diagnosis not present

## 2020-10-28 DIAGNOSIS — M8580 Other specified disorders of bone density and structure, unspecified site: Secondary | ICD-10-CM | POA: Diagnosis not present

## 2020-10-28 DIAGNOSIS — N402 Nodular prostate without lower urinary tract symptoms: Secondary | ICD-10-CM | POA: Diagnosis not present

## 2020-10-28 DIAGNOSIS — F339 Major depressive disorder, recurrent, unspecified: Secondary | ICD-10-CM | POA: Diagnosis not present

## 2020-10-31 DIAGNOSIS — F4323 Adjustment disorder with mixed anxiety and depressed mood: Secondary | ICD-10-CM | POA: Diagnosis not present

## 2020-10-31 DIAGNOSIS — E7800 Pure hypercholesterolemia, unspecified: Secondary | ICD-10-CM | POA: Diagnosis not present

## 2020-10-31 DIAGNOSIS — E559 Vitamin D deficiency, unspecified: Secondary | ICD-10-CM | POA: Diagnosis not present

## 2020-10-31 DIAGNOSIS — R7303 Prediabetes: Secondary | ICD-10-CM | POA: Diagnosis not present

## 2020-11-01 ENCOUNTER — Ambulatory Visit
Admission: RE | Admit: 2020-11-01 | Discharge: 2020-11-01 | Disposition: A | Discharge status: Home / Self Care | Payer: Medicare Other | Source: Ambulatory Visit | Attending: Urology | Admitting: Urology

## 2020-11-01 ENCOUNTER — Other Ambulatory Visit: Payer: Self-pay

## 2020-11-01 DIAGNOSIS — R9389 Abnormal findings on diagnostic imaging of other specified body structures: Secondary | ICD-10-CM

## 2020-11-01 DIAGNOSIS — K5900 Constipation, unspecified: Secondary | ICD-10-CM | POA: Diagnosis not present

## 2020-11-01 MED ORDER — GADOBENATE DIMEGLUMINE 529 MG/ML IV SOLN
12.0000 mL | Freq: Once | INTRAVENOUS | Status: AC | PRN
  Administered 2020-11-01: 12 mL via INTRAVENOUS

## 2020-11-04 ENCOUNTER — Encounter (HOSPITAL_COMMUNITY): Payer: Self-pay

## 2020-11-04 ENCOUNTER — Other Ambulatory Visit: Payer: Self-pay

## 2020-11-04 ENCOUNTER — Emergency Department (HOSPITAL_COMMUNITY)
Admission: EM | Admit: 2020-11-04 | Discharge: 2020-11-04 | Disposition: A | Discharge status: Home / Self Care | Payer: Medicare Other | Attending: Emergency Medicine | Admitting: Emergency Medicine

## 2020-11-04 DIAGNOSIS — I2510 Atherosclerotic heart disease of native coronary artery without angina pectoris: Secondary | ICD-10-CM | POA: Diagnosis not present

## 2020-11-04 DIAGNOSIS — R799 Abnormal finding of blood chemistry, unspecified: Secondary | ICD-10-CM | POA: Diagnosis present

## 2020-11-04 DIAGNOSIS — R001 Bradycardia, unspecified: Secondary | ICD-10-CM | POA: Insufficient documentation

## 2020-11-04 DIAGNOSIS — E871 Hypo-osmolality and hyponatremia: Secondary | ICD-10-CM | POA: Diagnosis not present

## 2020-11-04 LAB — URINALYSIS, ROUTINE W REFLEX MICROSCOPIC
Bilirubin Urine: NEGATIVE
Glucose, UA: NEGATIVE mg/dL
Hgb urine dipstick: NEGATIVE
Ketones, ur: NEGATIVE mg/dL
Leukocytes,Ua: NEGATIVE
Nitrite: NEGATIVE
Protein, ur: NEGATIVE mg/dL
Specific Gravity, Urine: 1.005 (ref 1.005–1.030)
pH: 8 (ref 5.0–8.0)

## 2020-11-04 LAB — COMPREHENSIVE METABOLIC PANEL
ALT: 32 U/L (ref 0–44)
AST: 59 U/L — ABNORMAL HIGH (ref 15–41)
Albumin: 3.8 g/dL (ref 3.5–5.0)
Alkaline Phosphatase: 57 U/L (ref 38–126)
Anion gap: 7 (ref 5–15)
BUN: 14 mg/dL (ref 8–23)
CO2: 26 mmol/L (ref 22–32)
Calcium: 8.7 mg/dL — ABNORMAL LOW (ref 8.9–10.3)
Chloride: 95 mmol/L — ABNORMAL LOW (ref 98–111)
Creatinine, Ser: 0.84 mg/dL (ref 0.61–1.24)
GFR, Estimated: >60 mL/min (ref >60)
Glucose, Bld: 90 mg/dL (ref 70–99)
Potassium: 4 mmol/L (ref 3.5–5.1)
Sodium: 128 mmol/L — ABNORMAL LOW (ref 135–145)
Total Bilirubin: 1 mg/dL (ref 0.3–1.2)
Total Protein: 6.3 g/dL — ABNORMAL LOW (ref 6.5–8.1)

## 2020-11-04 LAB — CBC
HCT: 34.5 % — ABNORMAL LOW (ref 39.0–52.0)
Hemoglobin: 11.4 g/dL — ABNORMAL LOW (ref 13.0–17.0)
MCH: 32.4 pg (ref 26.0–34.0)
MCHC: 33 g/dL (ref 30.0–36.0)
MCV: 98 fL (ref 80.0–100.0)
Platelets: 207 10*3/uL (ref 150–400)
RBC: 3.52 MIL/uL — ABNORMAL LOW (ref 4.22–5.81)
RDW: 13.2 % (ref 11.5–15.5)
WBC: 6.4 10*3/uL (ref 4.0–10.5)
nRBC: 0 % (ref 0.0–0.2)

## 2020-11-04 LAB — TSH: TSH: 2.362 u[IU]/mL (ref 0.350–4.500)

## 2020-11-04 LAB — MAGNESIUM: Magnesium: 2.2 mg/dL (ref 1.7–2.4)

## 2020-11-04 MED ORDER — SODIUM CHLORIDE 0.9 % IV BOLUS
500.0000 mL | Freq: Once | INTRAVENOUS | Status: AC
  Administered 2020-11-04: 500 mL via INTRAVENOUS

## 2020-11-04 NOTE — ED Triage Notes (Signed)
Patient reports that he had a sodium level -125 and was told by his PCP to come to the ED.

## 2020-11-04 NOTE — ED Provider Notes (Signed)
[facility] DEPT Provider Note   CSN: [id number] Arrival date & time: 11/04/20  1545     History Chief Complaint  Patient presents with  . Abnormal Lab    Elek Holderness is a 69 y.o. male.  The history is provided by the patient and medical records.  Abnormal Lab Clearance Chenault is a 69 y.o. male who presents to the Emergency Department complaining of abnormal lab. He was referred to the emergency department by his PCP for evaluation of hyponatremia on outpatient lab today. One week ago he saw his PCP and had labs drawn it was found to be hyponatremia with a sodium of 131. Today he had to follow-up for repeat sodium level and when it was drawn it was 125 and he was referred to the emergency department. He states that he is feeling well in his routine state of health. He did exercise today about an hour and a half on the elliptical machine. He did sweat  during the activity. He states that this was normal activity for him. He denies any recent medication changes or illnesses. Symptoms are mild and constant nature.  He denies any chest pain, shortness of breath, nausea, vomiting, diarrhea, numbness, weakness, edema.    Past Medical History:  Diagnosis Date  . Anxiety   . Bradycardia 07/23/2016  . CAD (coronary artery disease) 01/2012   cardiac cath...with 80% stenosis in proximal LAD with PCI     . Dilated cardiomyopathy ([place])    Echo 11/2012 EF 45-50%  . Dyslipidemia   . Elevated CPK    Proboly due to excessive exercise.  . Elevated LFTs    history of-Now resolved  . H/O vasectomy   . Hx of tonsillectomy   . Hypogonadotropic hypogonadism (HCC)    Dr. Buddy Duty and Dr Jeffie Pollock  . Inguinal hernia   . Low bone density for age    endo is following  . Low testosterone   . PVC's (premature ventricular contractions)    s/p ablation Dr Rayann Heman and Dr Radford Pax  . RBBB   . Vitamin D deficiency     Patient Active Problem List   Diagnosis Date Noted  . Syncope  06/28/2020  . Acute urinary retention 06/28/2020  . Hyponatremia 06/28/2020  . RBBB   . Bradycardia 07/23/2016  . Hyperlipidemia   . CAD (coronary artery disease) 01/14/2012  . Dilated cardiomyopathy ([place]) 01/12/2012  . PVC (premature ventricular contraction) 01/12/2012  . Hypogonadism male 01/12/2012    Past Surgical History:  Procedure Laterality Date  . CARDIAC CATHETERIZATION     w 80% stenosis in proimal LAD w PCI on 2/13.  [name] Kitchen COLONOSCOPY  10/2005   had internal nonbleeding small hemorrhoids,repeat in 10 years.  . CYSTECTOMY    . EP study and ablation for RVOT PVCs  08/12/12  . HERNIA REPAIR     Inguinal  . PERCUTANEOUS CORONARY STENT INTERVENTION (PCI-S) Bilateral 01/13/2012   Procedure: PERCUTANEOUS CORONARY STENT INTERVENTION (PCI-S);  Surgeon: Jettie Booze, MD;  Location: [facility] CATH LAB;  Service: Cardiovascular;  Laterality: Bilateral;  possible radial artery  . TONSILLECTOMY    . V-TACH ABLATION N/A 08/11/2012   Procedure: V-TACH ABLATION;  Surgeon: Thompson Grayer, MD;  Location: [facility] CATH LAB;  Service: Cardiovascular;  Laterality: N/A;  . VASECTOMY         Family History  Problem Relation Age of Onset  . Cancer Mother        died at 30yo    Social History  Tobacco Use  . Smoking status: Never Smoker  . Smokeless tobacco: Never Used  Vaping Use  . Vaping Use: Never used  Substance Use Topics  . Alcohol use: Yes    Comment: occasional  . Drug use: No    Home Medications Prior to Admission medications   Medication Sig Start Date End Date Taking? Authorizing Provider  acetaminophen (TYLENOL) 500 MG tablet Take 1,000 mg by mouth daily as needed. For sinus headaches    [provider]  BABY ASPIRIN PO Take 81 mg by mouth daily.    [provider]  cholecalciferol (VITAMIN D3) 25 MCG (1000 UT) tablet Take 1,000 Units by mouth daily.    [provider]  finasteride (PROSCAR) 5 MG tablet Take 1 tablet (5 mg total) by mouth daily.  06/29/20   Rai, Ripudeep Raliegh Ip, MD  Melatonin 3 MG CAPS Take 3 mg by mouth daily as needed (sleep).     [provider]  MULTIPLE VITAMIN PO Take 1 tablet by mouth every other day.     [provider]  nitroGLYCERIN (NITROSTAT) 0.4 MG SL tablet PLACE 1 TABLET UNDER TONGUE EVERY 5 MINUTES AS NEEDED FOR CHEST PAIN.[name] Kitchen MAXIMUM OF 3 DOSES 08/02/20   Sueanne Margarita, MD  ramipril (ALTACE) 2.5 MG capsule Take 2.5 mg by mouth daily.  12/09/18   [provider]  simvastatin (ZOCOR) 40 MG tablet Take 40 mg by mouth daily.    [provider]  Testosterone (FORTESTA) 10 MG/ACT (2%) GEL Place 4 Squirts onto the skin daily.     [provider]  sertraline (ZOLOFT) 50 MG tablet Take 1.5 tablets (75 mg total) by mouth daily. HOLD UNTIL FOLLOW-UP WITH YOUR PCP Patient not taking: Reported on 08/02/2020 06/28/20 11/04/20  Mendel Corning, MD    Allergies    Quinolones  Review of Systems   Review of Systems  All other systems reviewed and are negative.   Physical Exam Updated Vital Signs BP 133/89   Pulse (!) 43   Temp 98.8 F (37.1 C) (Oral)   Resp 12   Ht 5' 5.5" (1.664 m)   Wt 55.7 kg   SpO2 100%   BMI 20.12 kg/m   Physical Exam Vitals and nursing note reviewed.  Constitutional:      Appearance: He is well-developed.  HENT:     Head: Normocephalic and atraumatic.  Cardiovascular:     Rate and Rhythm: Regular rhythm. Bradycardia present.     Heart sounds: No murmur heard.   Pulmonary:     Effort: Pulmonary effort is normal. No respiratory distress.     Breath sounds: Normal breath sounds.  Abdominal:     Palpations: Abdomen is soft.     Tenderness: There is no abdominal tenderness. There is no guarding or rebound.  Musculoskeletal:        General: Swelling present. No tenderness.     Comments: 1+ pitting edema to BLE  Skin:    General: Skin is warm and dry.  Neurological:     Mental Status: He is alert and oriented to person, place, and time.    Psychiatric:        Behavior: Behavior normal.     ED Results / Procedures / Treatments   Labs (all labs ordered are listed, but only abnormal results are displayed) Labs Reviewed  COMPREHENSIVE METABOLIC PANEL - Abnormal; Notable for the following components:      Result Value   Sodium 128 (*)  Chloride 95 (*)    Calcium 8.7 (*)    Total Protein 6.3 (*)    AST 59 (*)    All other components within normal limits  CBC - Abnormal; Notable for the following components:   RBC 3.52 (*)    Hemoglobin 11.4 (*)    HCT 34.5 (*)    All other components within normal limits  URINALYSIS, ROUTINE W REFLEX MICROSCOPIC - Abnormal; Notable for the following components:   Color, Urine STRAW (*)    All other components within normal limits  MAGNESIUM  TSH    EKG EKG Interpretation  Date/Time:  Monday November 04 2020 18:38:37 EST Ventricular Rate:  45 PR Interval:    QRS Duration: 153 QT Interval:  468 QTC Calculation: 405 R Axis:   -73 Text Interpretation: Sinus bradycardia RBBB and LAFB Confirmed by Quintella Reichert [id number]) on 11/04/2020 6:50:32 PM   Radiology No results found.  Procedures Procedures (including critical care time)  Medications Ordered in ED Medications  sodium chloride 0.9 % bolus 500 mL (0 mLs Intravenous Stopped 11/04/20 1902)    ED Course  I have reviewed the triage vital signs and the nursing notes.  Pertinent labs & imaging results that were available during my care of the patient were reviewed by me and considered in my medical decision making (see chart for details).    MDM Rules/Calculators/A&P                         Patient referred to the emergency department for hyponatremia on outpatient lab draw. He is asymptomatic on ED presentation. Labs today significant for sodium of 128. This is improved compared to earlier. EKG with sinus bradycardia, similar when compared to priors. Patient appears euvolemic on evaluation. He does report excessive  sweating due to exercise that may have contributed to his low sodium. He states that he usually tries to avoid heavy exercise prior to lab draws. Feel patient is stable for PCP follow-up and evaluation for acute on chronic hyponatremia that is overall improving.  Final Clinical Impression(s) / ED Diagnoses Final diagnoses:  Hyponatremia    Rx / DC Orders ED Discharge Orders    None       Quintella Reichert, MD 11/04/20 1919

## 2020-11-05 DIAGNOSIS — I2510 Atherosclerotic heart disease of native coronary artery without angina pectoris: Secondary | ICD-10-CM | POA: Diagnosis not present

## 2020-11-05 DIAGNOSIS — R7303 Prediabetes: Secondary | ICD-10-CM | POA: Diagnosis not present

## 2020-11-05 DIAGNOSIS — F339 Major depressive disorder, recurrent, unspecified: Secondary | ICD-10-CM | POA: Diagnosis not present

## 2020-11-05 DIAGNOSIS — E871 Hypo-osmolality and hyponatremia: Secondary | ICD-10-CM | POA: Diagnosis not present

## 2020-11-05 DIAGNOSIS — N400 Enlarged prostate without lower urinary tract symptoms: Secondary | ICD-10-CM | POA: Diagnosis not present

## 2020-11-05 DIAGNOSIS — N402 Nodular prostate without lower urinary tract symptoms: Secondary | ICD-10-CM | POA: Diagnosis not present

## 2020-11-05 DIAGNOSIS — Z0000 Encounter for general adult medical examination without abnormal findings: Secondary | ICD-10-CM | POA: Diagnosis not present

## 2020-11-05 DIAGNOSIS — Z8679 Personal history of other diseases of the circulatory system: Secondary | ICD-10-CM | POA: Diagnosis not present

## 2020-11-05 DIAGNOSIS — E7800 Pure hypercholesterolemia, unspecified: Secondary | ICD-10-CM | POA: Diagnosis not present

## 2020-11-05 DIAGNOSIS — F411 Generalized anxiety disorder: Secondary | ICD-10-CM | POA: Diagnosis not present

## 2020-11-05 DIAGNOSIS — E559 Vitamin D deficiency, unspecified: Secondary | ICD-10-CM | POA: Diagnosis not present

## 2020-11-05 DIAGNOSIS — D649 Anemia, unspecified: Secondary | ICD-10-CM | POA: Diagnosis not present

## 2020-11-06 ENCOUNTER — Telehealth: Payer: Self-pay | Admitting: Cardiology

## 2020-11-06 NOTE — Telephone Encounter (Signed)
New Message:    Please call, would like to know what medicine is the pt supposed to be taking.

## 2020-11-06 NOTE — Telephone Encounter (Signed)
Spoke with the patient's wife and verified medications that the patient should be taking.

## 2020-11-07 ENCOUNTER — Other Ambulatory Visit: Payer: Self-pay | Admitting: Urology

## 2020-11-07 DIAGNOSIS — M9905 Segmental and somatic dysfunction of pelvic region: Secondary | ICD-10-CM | POA: Diagnosis not present

## 2020-11-07 DIAGNOSIS — S29012A Strain of muscle and tendon of back wall of thorax, initial encounter: Secondary | ICD-10-CM | POA: Diagnosis not present

## 2020-11-07 DIAGNOSIS — M5136 Other intervertebral disc degeneration, lumbar region: Secondary | ICD-10-CM | POA: Diagnosis not present

## 2020-11-07 DIAGNOSIS — M9903 Segmental and somatic dysfunction of lumbar region: Secondary | ICD-10-CM | POA: Diagnosis not present

## 2020-11-07 DIAGNOSIS — S138XXA Sprain of joints and ligaments of other parts of neck, initial encounter: Secondary | ICD-10-CM | POA: Diagnosis not present

## 2020-11-07 DIAGNOSIS — M9902 Segmental and somatic dysfunction of thoracic region: Secondary | ICD-10-CM | POA: Diagnosis not present

## 2020-11-07 DIAGNOSIS — M5137 Other intervertebral disc degeneration, lumbosacral region: Secondary | ICD-10-CM | POA: Diagnosis not present

## 2020-11-07 DIAGNOSIS — M9901 Segmental and somatic dysfunction of cervical region: Secondary | ICD-10-CM | POA: Diagnosis not present

## 2020-11-12 ENCOUNTER — Other Ambulatory Visit: Payer: Self-pay | Admitting: Family Medicine

## 2020-11-12 DIAGNOSIS — R7989 Other specified abnormal findings of blood chemistry: Secondary | ICD-10-CM | POA: Diagnosis not present

## 2020-11-12 DIAGNOSIS — E871 Hypo-osmolality and hyponatremia: Secondary | ICD-10-CM | POA: Diagnosis not present

## 2020-11-12 DIAGNOSIS — D649 Anemia, unspecified: Secondary | ICD-10-CM | POA: Diagnosis not present

## 2020-11-15 ENCOUNTER — Other Ambulatory Visit: Payer: Self-pay | Admitting: Internal Medicine

## 2020-11-15 DIAGNOSIS — E871 Hypo-osmolality and hyponatremia: Secondary | ICD-10-CM | POA: Diagnosis not present

## 2020-11-15 DIAGNOSIS — E230 Hypopituitarism: Secondary | ICD-10-CM | POA: Diagnosis not present

## 2020-11-18 DIAGNOSIS — E230 Hypopituitarism: Secondary | ICD-10-CM | POA: Diagnosis not present

## 2020-11-18 DIAGNOSIS — E871 Hypo-osmolality and hyponatremia: Secondary | ICD-10-CM | POA: Diagnosis not present

## 2020-11-20 DIAGNOSIS — F4323 Adjustment disorder with mixed anxiety and depressed mood: Secondary | ICD-10-CM | POA: Diagnosis not present

## 2020-11-21 DIAGNOSIS — E875 Hyperkalemia: Secondary | ICD-10-CM | POA: Diagnosis not present

## 2020-11-21 DIAGNOSIS — E871 Hypo-osmolality and hyponatremia: Secondary | ICD-10-CM | POA: Diagnosis not present

## 2020-11-21 DIAGNOSIS — E230 Hypopituitarism: Secondary | ICD-10-CM | POA: Diagnosis not present

## 2020-11-28 DIAGNOSIS — M9902 Segmental and somatic dysfunction of thoracic region: Secondary | ICD-10-CM | POA: Diagnosis not present

## 2020-11-28 DIAGNOSIS — M9905 Segmental and somatic dysfunction of pelvic region: Secondary | ICD-10-CM | POA: Diagnosis not present

## 2020-11-28 DIAGNOSIS — M5137 Other intervertebral disc degeneration, lumbosacral region: Secondary | ICD-10-CM | POA: Diagnosis not present

## 2020-11-28 DIAGNOSIS — S29012A Strain of muscle and tendon of back wall of thorax, initial encounter: Secondary | ICD-10-CM | POA: Diagnosis not present

## 2020-11-28 DIAGNOSIS — M9903 Segmental and somatic dysfunction of lumbar region: Secondary | ICD-10-CM | POA: Diagnosis not present

## 2020-11-28 DIAGNOSIS — S138XXA Sprain of joints and ligaments of other parts of neck, initial encounter: Secondary | ICD-10-CM | POA: Diagnosis not present

## 2020-11-28 DIAGNOSIS — M9901 Segmental and somatic dysfunction of cervical region: Secondary | ICD-10-CM | POA: Diagnosis not present

## 2020-11-28 DIAGNOSIS — M5136 Other intervertebral disc degeneration, lumbar region: Secondary | ICD-10-CM | POA: Diagnosis not present

## 2020-12-03 DIAGNOSIS — F39 Unspecified mood [affective] disorder: Secondary | ICD-10-CM | POA: Diagnosis not present

## 2020-12-09 ENCOUNTER — Ambulatory Visit
Admission: RE | Admit: 2020-12-09 | Discharge: 2020-12-09 | Disposition: A | Discharge status: Home / Self Care | Payer: Medicare Other | Source: Ambulatory Visit | Attending: Family Medicine | Admitting: Family Medicine

## 2020-12-09 DIAGNOSIS — K7689 Other specified diseases of liver: Secondary | ICD-10-CM | POA: Diagnosis not present

## 2020-12-09 DIAGNOSIS — R7989 Other specified abnormal findings of blood chemistry: Secondary | ICD-10-CM

## 2020-12-09 DIAGNOSIS — K760 Fatty (change of) liver, not elsewhere classified: Secondary | ICD-10-CM | POA: Diagnosis not present

## 2020-12-11 DIAGNOSIS — H5213 Myopia, bilateral: Secondary | ICD-10-CM | POA: Diagnosis not present

## 2020-12-11 DIAGNOSIS — H2513 Age-related nuclear cataract, bilateral: Secondary | ICD-10-CM | POA: Diagnosis not present

## 2020-12-11 DIAGNOSIS — H524 Presbyopia: Secondary | ICD-10-CM | POA: Diagnosis not present

## 2020-12-12 DIAGNOSIS — F411 Generalized anxiety disorder: Secondary | ICD-10-CM | POA: Diagnosis not present

## 2020-12-13 DIAGNOSIS — R972 Elevated prostate specific antigen [PSA]: Secondary | ICD-10-CM | POA: Diagnosis not present

## 2020-12-13 DIAGNOSIS — C61 Malignant neoplasm of prostate: Secondary | ICD-10-CM | POA: Diagnosis not present

## 2020-12-17 DIAGNOSIS — R7309 Other abnormal glucose: Secondary | ICD-10-CM | POA: Diagnosis not present

## 2020-12-17 DIAGNOSIS — E871 Hypo-osmolality and hyponatremia: Secondary | ICD-10-CM | POA: Diagnosis not present

## 2020-12-17 DIAGNOSIS — E559 Vitamin D deficiency, unspecified: Secondary | ICD-10-CM | POA: Diagnosis not present

## 2020-12-17 DIAGNOSIS — E230 Hypopituitarism: Secondary | ICD-10-CM | POA: Diagnosis not present

## 2020-12-17 DIAGNOSIS — M8580 Other specified disorders of bone density and structure, unspecified site: Secondary | ICD-10-CM | POA: Diagnosis not present

## 2020-12-19 DIAGNOSIS — S29012A Strain of muscle and tendon of back wall of thorax, initial encounter: Secondary | ICD-10-CM | POA: Diagnosis not present

## 2020-12-19 DIAGNOSIS — M9902 Segmental and somatic dysfunction of thoracic region: Secondary | ICD-10-CM | POA: Diagnosis not present

## 2020-12-19 DIAGNOSIS — M9905 Segmental and somatic dysfunction of pelvic region: Secondary | ICD-10-CM | POA: Diagnosis not present

## 2020-12-19 DIAGNOSIS — M9903 Segmental and somatic dysfunction of lumbar region: Secondary | ICD-10-CM | POA: Diagnosis not present

## 2020-12-19 DIAGNOSIS — S138XXA Sprain of joints and ligaments of other parts of neck, initial encounter: Secondary | ICD-10-CM | POA: Diagnosis not present

## 2020-12-19 DIAGNOSIS — M5136 Other intervertebral disc degeneration, lumbar region: Secondary | ICD-10-CM | POA: Diagnosis not present

## 2020-12-19 DIAGNOSIS — M9901 Segmental and somatic dysfunction of cervical region: Secondary | ICD-10-CM | POA: Diagnosis not present

## 2020-12-19 DIAGNOSIS — M5137 Other intervertebral disc degeneration, lumbosacral region: Secondary | ICD-10-CM | POA: Diagnosis not present

## 2020-12-23 DIAGNOSIS — M9901 Segmental and somatic dysfunction of cervical region: Secondary | ICD-10-CM | POA: Diagnosis not present

## 2020-12-23 DIAGNOSIS — S29012A Strain of muscle and tendon of back wall of thorax, initial encounter: Secondary | ICD-10-CM | POA: Diagnosis not present

## 2020-12-23 DIAGNOSIS — M9902 Segmental and somatic dysfunction of thoracic region: Secondary | ICD-10-CM | POA: Diagnosis not present

## 2020-12-23 DIAGNOSIS — M5137 Other intervertebral disc degeneration, lumbosacral region: Secondary | ICD-10-CM | POA: Diagnosis not present

## 2020-12-23 DIAGNOSIS — S138XXA Sprain of joints and ligaments of other parts of neck, initial encounter: Secondary | ICD-10-CM | POA: Diagnosis not present

## 2020-12-23 DIAGNOSIS — M9903 Segmental and somatic dysfunction of lumbar region: Secondary | ICD-10-CM | POA: Diagnosis not present

## 2020-12-23 DIAGNOSIS — M5136 Other intervertebral disc degeneration, lumbar region: Secondary | ICD-10-CM | POA: Diagnosis not present

## 2020-12-23 DIAGNOSIS — M9905 Segmental and somatic dysfunction of pelvic region: Secondary | ICD-10-CM | POA: Diagnosis not present

## 2020-12-25 DIAGNOSIS — E349 Endocrine disorder, unspecified: Secondary | ICD-10-CM | POA: Diagnosis not present

## 2020-12-25 DIAGNOSIS — N401 Enlarged prostate with lower urinary tract symptoms: Secondary | ICD-10-CM | POA: Diagnosis not present

## 2020-12-25 DIAGNOSIS — R351 Nocturia: Secondary | ICD-10-CM | POA: Diagnosis not present

## 2020-12-25 DIAGNOSIS — N5201 Erectile dysfunction due to arterial insufficiency: Secondary | ICD-10-CM | POA: Diagnosis not present

## 2020-12-25 DIAGNOSIS — C61 Malignant neoplasm of prostate: Secondary | ICD-10-CM | POA: Diagnosis not present

## 2020-12-30 ENCOUNTER — Other Ambulatory Visit: Payer: Self-pay | Admitting: Urology

## 2020-12-30 DIAGNOSIS — M9903 Segmental and somatic dysfunction of lumbar region: Secondary | ICD-10-CM | POA: Diagnosis not present

## 2020-12-30 DIAGNOSIS — C61 Malignant neoplasm of prostate: Secondary | ICD-10-CM

## 2020-12-30 DIAGNOSIS — M5137 Other intervertebral disc degeneration, lumbosacral region: Secondary | ICD-10-CM | POA: Diagnosis not present

## 2020-12-30 DIAGNOSIS — M9901 Segmental and somatic dysfunction of cervical region: Secondary | ICD-10-CM | POA: Diagnosis not present

## 2020-12-30 DIAGNOSIS — S138XXA Sprain of joints and ligaments of other parts of neck, initial encounter: Secondary | ICD-10-CM | POA: Diagnosis not present

## 2020-12-30 DIAGNOSIS — M5136 Other intervertebral disc degeneration, lumbar region: Secondary | ICD-10-CM | POA: Diagnosis not present

## 2020-12-30 DIAGNOSIS — M9905 Segmental and somatic dysfunction of pelvic region: Secondary | ICD-10-CM | POA: Diagnosis not present

## 2020-12-30 DIAGNOSIS — M9902 Segmental and somatic dysfunction of thoracic region: Secondary | ICD-10-CM | POA: Diagnosis not present

## 2020-12-30 DIAGNOSIS — S29012A Strain of muscle and tendon of back wall of thorax, initial encounter: Secondary | ICD-10-CM | POA: Diagnosis not present

## 2021-01-01 ENCOUNTER — Other Ambulatory Visit: Payer: Self-pay

## 2021-01-01 ENCOUNTER — Encounter (HOSPITAL_COMMUNITY)
Admission: RE | Admit: 2021-01-01 | Discharge: 2021-01-01 | Disposition: A | Discharge status: Home / Self Care | Payer: Medicare Other | Source: Ambulatory Visit | Attending: Urology | Admitting: Urology

## 2021-01-01 DIAGNOSIS — C61 Malignant neoplasm of prostate: Secondary | ICD-10-CM | POA: Diagnosis not present

## 2021-01-01 MED ORDER — TECHNETIUM TC 99M MEDRONATE IV KIT
21.2000 | PACK | Freq: Once | INTRAVENOUS | Status: AC | PRN
  Administered 2021-01-01: 21.2 via INTRAVENOUS

## 2021-01-07 DIAGNOSIS — N3941 Urge incontinence: Secondary | ICD-10-CM | POA: Diagnosis not present

## 2021-01-07 DIAGNOSIS — C61 Malignant neoplasm of prostate: Secondary | ICD-10-CM | POA: Diagnosis not present

## 2021-01-08 DIAGNOSIS — R3915 Urgency of urination: Secondary | ICD-10-CM | POA: Diagnosis not present

## 2021-01-08 DIAGNOSIS — N3941 Urge incontinence: Secondary | ICD-10-CM | POA: Diagnosis not present

## 2021-01-08 DIAGNOSIS — M62838 Other muscle spasm: Secondary | ICD-10-CM | POA: Diagnosis not present

## 2021-01-08 DIAGNOSIS — M6281 Muscle weakness (generalized): Secondary | ICD-10-CM | POA: Diagnosis not present

## 2021-01-08 DIAGNOSIS — M6289 Other specified disorders of muscle: Secondary | ICD-10-CM | POA: Diagnosis not present

## 2021-01-08 DIAGNOSIS — R351 Nocturia: Secondary | ICD-10-CM | POA: Diagnosis not present

## 2021-01-09 DIAGNOSIS — F411 Generalized anxiety disorder: Secondary | ICD-10-CM | POA: Diagnosis not present

## 2021-01-10 ENCOUNTER — Other Ambulatory Visit: Payer: Self-pay

## 2021-01-10 ENCOUNTER — Other Ambulatory Visit (HOSPITAL_COMMUNITY): Payer: Self-pay | Admitting: Urology

## 2021-01-10 ENCOUNTER — Ambulatory Visit (HOSPITAL_COMMUNITY)
Admission: RE | Admit: 2021-01-10 | Discharge: 2021-01-10 | Disposition: A | Discharge status: Home / Self Care | Payer: Medicare Other | Source: Ambulatory Visit | Attending: Urology | Admitting: Urology

## 2021-01-10 DIAGNOSIS — M4186 Other forms of scoliosis, lumbar region: Secondary | ICD-10-CM | POA: Diagnosis not present

## 2021-01-10 DIAGNOSIS — Z8546 Personal history of malignant neoplasm of prostate: Secondary | ICD-10-CM | POA: Diagnosis not present

## 2021-01-10 DIAGNOSIS — R937 Abnormal findings on diagnostic imaging of other parts of musculoskeletal system: Secondary | ICD-10-CM | POA: Insufficient documentation

## 2021-01-10 DIAGNOSIS — M47816 Spondylosis without myelopathy or radiculopathy, lumbar region: Secondary | ICD-10-CM | POA: Diagnosis not present

## 2021-01-13 ENCOUNTER — Telehealth: Payer: Self-pay | Admitting: Cardiology

## 2021-01-13 NOTE — Telephone Encounter (Signed)
° °  [facility] Pre-operative Risk Assessment    HEARTCARE STAFF: - Please ensure there is not already an duplicate clearance open for this procedure. - Under Visit Info/Reason for Call, type in Other and utilize the format Clearance MM/DD/YY or Clearance TBD. Do not use dashes or single digits. - If request is for dental extraction, please clarify the # of teeth to be extracted.  Request for surgical clearance:  1. What type of surgery is being performed? Robert assist laparoscopy radical Prostatectomy with bilateral pelvic lymphadenectomy    2. When is this surgery scheduled? TBD    3. What type of clearance is required (medical clearance vs. Pharmacy clearance to hold med vs. Both)? Medical , they are are allow him to stay on the Asprin   4. Are there any medications that need to be held prior to surgery and how long?NA   5. Practice name and name of physician performing surgery? [facility]  6. What is the office phone number? [phone]   7.   What is the office fax number? [phone]  8.   Anesthesia type (None, local, MAC, general) ? General    Shana A Stovall 01/13/2021, 11:01 AM  _________________________________________________________________   (provider comments below)

## 2021-01-17 NOTE — Telephone Encounter (Signed)
   Primary Cardiologist: Fransico Him, MD  Chart reviewed as part of pre-operative protocol coverage. Patient was contacted 01/17/2021 in reference to pre-operative risk assessment for pending surgery as outlined below.  Patrick Barnes was last seen on 08/02/20 by Dr. Radford Pax.  Since that day, Patrick Barnes has done well.  Therefore, based on ACC/AHA guidelines, the patient would be at acceptable risk for the planned procedure without further cardiovascular testing.   The patient was advised that if he develops new symptoms prior to surgery to contact our office to arrange for a follow-up visit, and he verbalized understanding.  I will route this recommendation to the requesting party via Epic fax function and remove from pre-op pool. Please call with questions.  Patrick Dubonnet, NP 01/17/2021, 10:41 AM

## 2021-01-19 DIAGNOSIS — E7800 Pure hypercholesterolemia, unspecified: Secondary | ICD-10-CM | POA: Diagnosis not present

## 2021-01-19 DIAGNOSIS — N400 Enlarged prostate without lower urinary tract symptoms: Secondary | ICD-10-CM | POA: Diagnosis not present

## 2021-01-19 DIAGNOSIS — N402 Nodular prostate without lower urinary tract symptoms: Secondary | ICD-10-CM | POA: Diagnosis not present

## 2021-01-19 DIAGNOSIS — I2510 Atherosclerotic heart disease of native coronary artery without angina pectoris: Secondary | ICD-10-CM | POA: Diagnosis not present

## 2021-01-19 DIAGNOSIS — M8580 Other specified disorders of bone density and structure, unspecified site: Secondary | ICD-10-CM | POA: Diagnosis not present

## 2021-01-19 DIAGNOSIS — F339 Major depressive disorder, recurrent, unspecified: Secondary | ICD-10-CM | POA: Diagnosis not present

## 2021-01-21 NOTE — Telephone Encounter (Signed)
Please refax clearance, pt states [facility] has not received this clearance.

## 2021-01-21 NOTE — Telephone Encounter (Signed)
Refaxed to [facility].   Will remove from preop pool   Loel Dubonnet, NP

## 2021-01-28 ENCOUNTER — Other Ambulatory Visit: Payer: Self-pay | Admitting: Urology

## 2021-01-30 DIAGNOSIS — F411 Generalized anxiety disorder: Secondary | ICD-10-CM | POA: Diagnosis not present

## 2021-02-03 NOTE — Patient Instructions (Addendum)
DUE TO COVID-19 ONLY ONE VISITOR IS ALLOWED TO COME WITH YOU AND STAY IN THE WAITING ROOM ONLY DURING PRE OP AND PROCEDURE DAY OF SURGERY. THE 1 VISITOR  MAY VISIT WITH YOU AFTER SURGERY IN YOUR PRIVATE ROOM DURING VISITING HOURS ONLY!  YOU NEED TO HAVE A COVID 19 TEST ON_3/10______ @1 :00 p_______, THIS TEST MUST BE DONE BEFORE SURGERY,  COVID TESTING SITE [place] [address], IT IS ON THE RIGHT GOING OUT [address] APPROXIMATELY  2 MINUTES PAST [employer] ON THE RIGHT. ONCE YOUR COVID TEST IS COMPLETED,  PLEASE BEGIN THE QUARANTINE INSTRUCTIONS AS OUTLINED IN YOUR HANDOUT.                Patrick Barnes   Your procedure is scheduled on: 02/10/21   Report to [facility] Main  Entrance   Report to Short stay at 5:30 AM     Call this number if you have problems the morning of surgery [phone]    Remember: Do not eat food or drink liquids :After Midnight.   BRUSH YOUR TEETH MORNING OF SURGERY AND RINSE YOUR MOUTH OUT, NO CHEWING GUM CANDY OR MINTS.     Take these medicines the morning of surgery with A SIP OF WATER: Lexapro, Solifenacian                                 You may not have any metal on your body including              piercings  Do not wear jewelry, lotions, powders or deodorant              Men may shave face and neck.   Do not bring valuables to the hospital. [place].  Contacts, dentures or bridgework may not be worn into surgery.                  Please read over the following fact sheets you were given: _____________________________________________________________________             [facility] - Preparing for Surgery Before surgery, you can play an important role.  Because skin is not sterile, your skin needs to be as free of germs as possible.  You can reduce the number of germs on your skin by washing with CHG (chlorahexidine gluconate) soap before surgery.   CHG is an antiseptic cleaner which kills germs and bonds with the skin to continue killing germs even after washing. Please DO NOT use if you have an allergy to CHG or antibacterial soaps.  If your skin becomes reddened/irritated stop using the CHG and inform your nurse when you arrive at Short Stay. r.  You may shave your face/neck. Please follow these instructions carefully:  1.  Shower with CHG Soap the night before surgery and the  morning of Surgery.  2.  If you choose to wash your hair, wash your hair first as usual with your  normal  shampoo.  3.  After you shampoo, rinse your hair and body thoroughly to remove the  shampoo.  4.  Use CHG as you would any other liquid soap.  You can apply chg directly  to the skin and wash                       Gently with a scrungie or clean washcloth.  5.  Apply the CHG Soap to your body ONLY FROM THE NECK DOWN.   Do not use on face/ open                           Wound or open sores. Avoid contact with eyes, ears mouth and genitals (private parts).                       Wash face,  Genitals (private parts) with your normal soap.             6.  Wash thoroughly, paying special attention to the area where your surgery  will be performed.  7.  Thoroughly rinse your body with warm water from the neck down.  8.  DO NOT shower/wash with your normal soap after using and rinsing off  the CHG Soap.             9.  Pat yourself dry with a clean towel.            10.  Wear clean pajamas.            11.  Place clean sheets on your bed the night of your first shower and do not  sleep with pets. Day of Surgery : Do not apply any lotions/deodorants the morning of surgery.  Please wear clean clothes to the hospital/surgery center.  FAILURE TO FOLLOW THESE INSTRUCTIONS MAY RESULT IN THE CANCELLATION OF YOUR SURGERY PATIENT SIGNATURE_________________________________  NURSE  SIGNATURE__________________________________  ________________________________________________________________________   Adam Phenix  An incentive spirometer is a tool that can help keep your lungs clear and active. This tool measures how well you are filling your lungs with each breath. Taking long deep breaths may help reverse or decrease the chance of developing breathing (pulmonary) problems (especially infection) following:  A long period of time when you are unable to move or be active. BEFORE THE PROCEDURE   If the spirometer includes an indicator to show your best effort, your nurse or respiratory therapist will set it to a desired goal.  If possible, sit up straight or lean slightly forward. Try not to slouch.  Hold the incentive spirometer in an upright position. INSTRUCTIONS FOR USE  1. Sit on the edge of your bed if possible, or sit up as far as you can in bed or on a chair. 2. Hold the incentive spirometer in an upright position. 3. Breathe out normally. 4. Place the mouthpiece in your mouth and seal your lips tightly around it. 5. Breathe in slowly and as deeply as possible, raising the piston or the ball toward the top of the column. 6. Hold your breath for 3-5 seconds or for as long as possible. Allow the piston or ball to fall to the bottom of the column. 7. Remove the mouthpiece from your mouth and breathe out normally. 8. Rest for a few seconds and repeat Steps 1 through 7 at least 10 times every 1-2 hours when you are awake. Take your time and take a few normal breaths between deep breaths. 9. The spirometer may include an indicator to show your best effort.  Use the indicator as a goal to work toward during each repetition. 10. After each set of 10 deep breaths, practice coughing to be sure your lungs are clear. If you have an incision (the cut made at the time of surgery), support your incision when coughing by placing a pillow or rolled up towels firmly  against it. Once you are able to get out of bed, walk around indoors and cough well. You may stop using the incentive spirometer when instructed by your caregiver.  RISKS AND COMPLICATIONS  Take your time so you do not get dizzy or light-headed.  If you are in pain, you may need to take or ask for pain medication before doing incentive spirometry. It is harder to take a deep breath if you are having pain. AFTER USE  Rest and breathe slowly and easily.  It can be helpful to keep track of a log of your progress. Your caregiver can provide you with a simple table to help with this. If you are using the spirometer at home, follow these instructions: [place] IF:   You are having difficultly using the spirometer.  You have trouble using the spirometer as often as instructed.  Your pain medication is not giving enough relief while using the spirometer.  You develop fever of 100.5 F (38.1 C) or higher. SEEK IMMEDIATE MEDICAL CARE IF:   You cough up bloody sputum that had not been present before.  You develop fever of 102 F (38.9 C) or greater.  You develop worsening pain at or near the incision site. MAKE SURE YOU:   Understand these instructions.  Will watch your condition.  Will get help right away if you are not doing well or get worse. Document Released: 03/29/2007 Document Revised: 02/08/2012 Document Reviewed: 05/30/2007 [facility] Patient Information 2014 [place], [place].   ________________________________________________________________________

## 2021-02-04 ENCOUNTER — Encounter (HOSPITAL_COMMUNITY): Payer: Self-pay

## 2021-02-04 ENCOUNTER — Other Ambulatory Visit: Payer: Self-pay

## 2021-02-04 ENCOUNTER — Encounter (HOSPITAL_COMMUNITY)
Admission: RE | Admit: 2021-02-04 | Discharge: 2021-02-04 | Disposition: A | Discharge status: Home / Self Care | Payer: Medicare Other | Source: Ambulatory Visit | Attending: Urology | Admitting: Urology

## 2021-02-04 DIAGNOSIS — Z7982 Long term (current) use of aspirin: Secondary | ICD-10-CM | POA: Insufficient documentation

## 2021-02-04 DIAGNOSIS — I4510 Unspecified right bundle-branch block: Secondary | ICD-10-CM | POA: Diagnosis not present

## 2021-02-04 DIAGNOSIS — I420 Dilated cardiomyopathy: Secondary | ICD-10-CM | POA: Diagnosis not present

## 2021-02-04 DIAGNOSIS — I493 Ventricular premature depolarization: Secondary | ICD-10-CM | POA: Insufficient documentation

## 2021-02-04 DIAGNOSIS — Z01812 Encounter for preprocedural laboratory examination: Secondary | ICD-10-CM | POA: Diagnosis not present

## 2021-02-04 DIAGNOSIS — Z79899 Other long term (current) drug therapy: Secondary | ICD-10-CM | POA: Diagnosis not present

## 2021-02-04 DIAGNOSIS — C61 Malignant neoplasm of prostate: Secondary | ICD-10-CM | POA: Insufficient documentation

## 2021-02-04 DIAGNOSIS — Z79890 Hormone replacement therapy (postmenopausal): Secondary | ICD-10-CM | POA: Diagnosis not present

## 2021-02-04 DIAGNOSIS — I2510 Atherosclerotic heart disease of native coronary artery without angina pectoris: Secondary | ICD-10-CM | POA: Insufficient documentation

## 2021-02-04 HISTORY — DX: Unspecified osteoarthritis, unspecified site: M19.90

## 2021-02-04 HISTORY — DX: Malignant (primary) neoplasm, unspecified: C80.1

## 2021-02-04 LAB — CBC
HCT: 34.8 % — ABNORMAL LOW (ref 39.0–52.0)
Hemoglobin: 10.9 g/dL — ABNORMAL LOW (ref 13.0–17.0)
MCH: 32 pg (ref 26.0–34.0)
MCHC: 31.3 g/dL (ref 30.0–36.0)
MCV: 102.1 fL — ABNORMAL HIGH (ref 80.0–100.0)
Platelets: 245 10*3/uL (ref 150–400)
RBC: 3.41 MIL/uL — ABNORMAL LOW (ref 4.22–5.81)
RDW: 13 % (ref 11.5–15.5)
WBC: 5.4 10*3/uL (ref 4.0–10.5)
nRBC: 0 % (ref 0.0–0.2)

## 2021-02-04 LAB — BASIC METABOLIC PANEL
Anion gap: 4 — ABNORMAL LOW (ref 5–15)
BUN: 18 mg/dL (ref 8–23)
CO2: 26 mmol/L (ref 22–32)
Calcium: 8.8 mg/dL — ABNORMAL LOW (ref 8.9–10.3)
Chloride: 104 mmol/L (ref 98–111)
Creatinine, Ser: 0.94 mg/dL (ref 0.61–1.24)
GFR, Estimated: >60 mL/min (ref >60)
Glucose, Bld: 67 mg/dL — ABNORMAL LOW (ref 70–99)
Potassium: 4 mmol/L (ref 3.5–5.1)
Sodium: 134 mmol/L — ABNORMAL LOW (ref 135–145)

## 2021-02-04 NOTE — Progress Notes (Signed)
COVID Vaccine Completed:Yes Date COVID Vaccine completed:01/10/20-booster 8/18 COVID vaccine manufacturer: Pfizer      PCP - Dr. Edwin Dada Cardiologist - Dr. Ashok Norris  Chest x-ray - 02/25/18-epic EKG - 11/04/20-epic Stress Test - 02/08/18-epic ECHO - 02/08/18-epic Cardiac Cath - 2006, 2013 with stents, Ablation 2013 Pacemaker/ICD device last checked:NA  Sleep Study - yes- negative CPAP - no  Fasting Blood Sugar -NA  Checks Blood Sugar _____ times a day  Blood Thinner Instructions:ASA 81/barnes Aspirin Instructions:none Last Dose:Pt stopped for dental work on 01/01/21  Anesthesia review:   Patient denies shortness of breath, fever, cough and chest pain at PAT appointment yes  Patient verbalized understanding of instructions that were given to them at the PAT appointment. Patient was also instructed that they will need to review over the PAT instructions again at home before surgery.yes Pt reports no SOB with any activities

## 2021-02-05 NOTE — Anesthesia Preprocedure Evaluation (Addendum)
Anesthesia Evaluation  Patient identified by MRN, date of birth, ID band Patient awake    Reviewed: Allergy & Precautions, NPO status , Patient's Chart, lab work & pertinent test results  Airway Mallampati: II  TM Distance: >3 FB Neck ROM: Full    Dental no notable dental hx.    Pulmonary neg pulmonary ROS,    Pulmonary exam normal breath sounds clear to auscultation       Cardiovascular + CAD  Normal cardiovascular exam Rhythm:Regular Rate:Normal     Neuro/Psych Anxiety negative neurological ROS  negative psych ROS   GI/Hepatic negative GI ROS, Neg liver ROS,   Endo/Other  negative endocrine ROS  Renal/GU negative Renal ROS  negative genitourinary   Musculoskeletal  (+) Arthritis , Osteoarthritis,    Abdominal   Peds negative pediatric ROS (+)  Hematology negative hematology ROS (+)   Anesthesia Other Findings Prostate Cancer  Reproductive/Obstetrics negative OB ROS                             Anesthesia Physical Anesthesia Plan  ASA: III  Anesthesia Plan: General   Post-op Pain Management:    Induction: Intravenous  PONV Risk Score and Plan: 2 and Ondansetron, Midazolam and Treatment may vary due to age or medical condition  Airway Management Planned: Oral ETT  Additional Equipment:   Intra-op Plan:   Post-operative Plan: Extubation in OR  Informed Consent: I have reviewed the patients History and Physical, chart, labs and discussed the procedure including the risks, benefits and alternatives for the proposed anesthesia with the patient or authorized representative who has indicated his/her understanding and acceptance.     Dental advisory given  Plan Discussed with: CRNA  Anesthesia Plan Comments: (See PAT note 02/04/2021, Konrad Felix, PA-C)       Anesthesia Quick Evaluation

## 2021-02-05 NOTE — Progress Notes (Signed)
Anesthesia Chart Review   Case: [id number] Date/Time: 02/10/21 0700   Procedures:      XI ROBOTIC ASSISTED LAPAROSCOPIC RADICAL PROSTATECTOMY LEVEL 2 (N/A )     LYMPHADENECTOMY, PELVIC (Bilateral )   Anesthesia type: General   Pre-op diagnosis: PROSTATE CANCER   Location: [facility] ROOM 03 / [facility]   Surgeons: Raynelle Bring, MD      DISCUSSION:69 y.o. never smoker with h/o ischemic DCM (EF 45-50%), CAD (PCI of LAD 2013), RBBB, PVCs, prostate cancer scheduled for above procedure 02/10/2021 with Dr. Raynelle Bring.   Per cardiology preoperative evaluation 01/17/2021, "Chart reviewed as part of pre-operative protocol coverage. Patient was contacted 01/17/2021 in reference to pre-operative risk assessment for pending surgery as outlined below.  Patrick Barnes was last seen on 08/02/20 by Dr. Radford Pax.  Since that day, Patrick Barnes has done well.  Therefore, based on ACC/AHA guidelines, the patient would be at acceptable risk for the planned procedure without further cardiovascular testing."  Anticipate pt can proceed with planned procedure barring acute status change.   VS: BP 109/74   Pulse 99   Temp 36.6 C (Oral)   Resp 20   Ht 5' 5.5" (1.664 m)   Wt 56.7 kg   SpO2 100%   BMI 20.48 kg/m   PROVIDERS: Leighton Ruff, MD is PCP   Fransico Him, MD is Cardiologist  LABS: Labs reviewed: Acceptable for surgery. (all labs ordered are listed, but only abnormal results are displayed)  Labs Reviewed  CBC - Abnormal; Notable for the following components:      Result Value   RBC 3.41 (*)    Hemoglobin 10.9 (*)    HCT 34.8 (*)    MCV 102.1 (*)    All other components within normal limits  BASIC METABOLIC PANEL - Abnormal; Notable for the following components:   Sodium 134 (*)    Glucose, Bld 67 (*)    Calcium 8.8 (*)    Anion gap 4 (*)    All other components within normal limits  TYPE AND SCREEN     IMAGES:   EKG: 11/04/2020 Rate 45 bpm  Sinus bradycardia RBBB and  LAFB  CV: Stress Test 02/08/2018  The patient walked for a total of 8 minutes on a Bruce protocol treadmill test. He had a right bundle branch block at baseline. He achieved a peak heart rate of 109 which is only 71% predicted maximal heart rate. The patient was changed to a Lexiscan protocol.  This is a low risk study. There is no evidence of ischemia.  The left ventricle is mildly dilated. Nuclear stress EF: 43%. The left ventricular ejection fraction is mildly decreased (45-54%).   Echo 02/08/2018 Study Conclusions   - Left ventricle: The cavity size was normal. Wall thickness was  normal. Systolic function was mildly to moderately reduced. The  estimated ejection fraction was in the range of 40% to 45%.  Diffuse hypokinesis. Features are consistent with a pseudonormal  left ventricular filling pattern, with concomitant abnormal  relaxation and increased filling pressure (grade 2 diastolic  dysfunction).  - Aortic valve: There was trivial regurgitation.  - Left atrium: The atrium was mildly dilated.  - Right ventricle: The cavity size was moderately dilated.  - Right atrium: The atrium was severely dilated.   Impressions:   - Mild to moderate global reduction in LV systolic function;  moderate diastolic dysfunction; trace AI; mild LAE; moderate RVE;  severe RAE.  Past Medical History:  Diagnosis Date  .  Anxiety   . Arthritis    hands  . Bradycardia 07/23/2016  . CAD (coronary artery disease) 01/2012   cardiac cath...with 80% stenosis in proximal LAD with PCI     . Cancer [facility])    prostate  . Dilated cardiomyopathy ([place])    Echo 11/2012 EF 45-50%  . Dyslipidemia   . Elevated CPK    Proboly due to excessive exercise.  . Elevated LFTs    history of-Now resolved  . H/O vasectomy   . Hx of tonsillectomy   . Hypogonadotropic hypogonadism ([facility])    Dr. Buddy Duty and Dr Jeffie Pollock  . Inguinal hernia   . Low bone density for age    endo is following  . Low  testosterone   . PVC's (premature ventricular contractions)    s/p ablation Dr Rayann Heman and Dr Radford Pax  . RBBB   . Vitamin D deficiency     Past Surgical History:  Procedure Laterality Date  . CARDIAC CATHETERIZATION     w 80% stenosis in proimal LAD w PCI on 2/13.  [name] Kitchen COLONOSCOPY  10/2005   had internal nonbleeding small hemorrhoids,repeat in 10 years.  . CYSTECTOMY    . EP study and ablation for RVOT PVCs  08/12/12  . HERNIA REPAIR     Inguinal  . PERCUTANEOUS CORONARY STENT INTERVENTION (PCI-S) Bilateral 01/13/2012   Procedure: PERCUTANEOUS CORONARY STENT INTERVENTION (PCI-S);  Surgeon: Jettie Booze, MD;  Location: [facility] CATH LAB;  Service: Cardiovascular;  Laterality: Bilateral;  possible radial artery  . TONSILLECTOMY    . V-TACH ABLATION N/A 08/11/2012   Procedure: V-TACH ABLATION;  Surgeon: Thompson Grayer, MD;  Location: [facility] CATH LAB;  Service: Cardiovascular;  Laterality: N/A;  . VASECTOMY      MEDICATIONS: . acetaminophen (TYLENOL) 500 MG tablet  . aspirin EC 81 MG tablet  . cholecalciferol (VITAMIN D3) 25 MCG (1000 UT) tablet  . escitalopram (LEXAPRO) 10 MG tablet  . Melatonin 3 MG CAPS  . MULTIPLE VITAMIN PO  . nitroGLYCERIN (NITROSTAT) 0.4 MG SL tablet  . ramipril (ALTACE) 2.5 MG capsule  . silodosin (RAPAFLO) 4 MG CAPS capsule  . simvastatin (ZOCOR) 40 MG tablet  . solifenacin (VESICARE) 5 MG tablet  . Testosterone (FORTESTA) 10 MG/ACT (2%) GEL   No current facility-administered medications for this encounter.    Konrad Felix, PA-C [facility] Pre-Surgical Testing [phone]

## 2021-02-06 ENCOUNTER — Other Ambulatory Visit (HOSPITAL_COMMUNITY)
Admission: RE | Admit: 2021-02-06 | Discharge: 2021-02-06 | Disposition: A | Discharge status: Home / Self Care | Payer: Medicare Other | Source: Ambulatory Visit | Attending: Urology | Admitting: Urology

## 2021-02-06 DIAGNOSIS — Z20822 Contact with and (suspected) exposure to covid-19: Secondary | ICD-10-CM | POA: Insufficient documentation

## 2021-02-06 DIAGNOSIS — Z01812 Encounter for preprocedural laboratory examination: Secondary | ICD-10-CM | POA: Insufficient documentation

## 2021-02-06 LAB — SARS CORONAVIRUS 2 (TAT 6-24 HRS): SARS Coronavirus 2: NEGATIVE

## 2021-02-07 NOTE — H&P (Signed)
CC: Prostate Cancer    Patrick Barnes is a 70 year old gentleman who was evaluated by Dr. Jeffie Pollock for lower urinary tract symptoms. As part of his evaluation, he underwent urodynamics that demonstrated some evidence of mild functional obstruction and bladder instability. He has been treated with tamsulosin but switched to silodosin due to orthostasis side effects. He also underwent a TRUS as part of his evaluation that indicated a 13 cc prostate volume but with abnormal appearing seminal vesicles. He then proceeded with an MRI of the prostate on 11/01/20 that indicated a small volume gland with a PI-RADS 4 and PI-RADS 3 lesion without seminal vesicle invasion or abnormalities noted. His PSA was only 0.72. He underwent a TRUS biopsy of the prostate on 12/13/20 that confirmed Gleason 4+3=7 adenocarcinoma of the prostate with12 out of 17 cores positive for malignancy including 2 out of the 4 targeted biopsies. A biopsy of his right seminal vesicle was benign.   Family history: None.   Imaging studies:  MRI (11/01/20): No EPE, SVI, LAD, or bone lesions.  Bone scan (01/01/21): Negative for metastatic disease although there was some questionable uptake at the L2/L3 vertebral level. This is most likely consistent with degenerative changes. However, Dr. Jeffie Pollock has apparently ordered plain films for further evaluation.   PMH: He has a history of PVCs s/p cardiac ablation, hyperlipidemia, coronary artery disease, depression, and left bundle branch block.  PSH: No abdominal surgery. He does have a history of a prior cardiac stent.   TNM stage: cT1c N0 Mx  PSA: 0.72  Gleason score: 4+3=7 (GG 3)  Biopsy (12/13/20: 12/17 cores positive  Left: L lateral apex (5%, 3+3=6), L apex (20%, 3+4=7), L lateral mid (10%, 3+3=6), L mid (30%, 3+4=7, PNI), L lateral base (50%, 3+4=7, PNI), L base (5%, 3+4=7, PNI)  Right: R apex (5%, 3+4=7, PNI), R lateral apex (5%, 4+3=7), R mid (20%, 4+3=7), R lateral mid (10%, 3+4=7)  Targeted:  2/4 cores positive (50%, 50%, 3+4=7)  Prostate volume: 14.0 cc   Nomogram  OC disease: 43%  EPE: 51%  SVI: 7%  LNI: 8%  PFS (5 year, 10 year): 85%, 75%   Urinary function: IPSS is 12. He is currently on silodosin and solifenacin 5 mg. He has found each of these medications to have mild benefit. His most bothersome symptoms remain urinary urgency, frequency, and nocturia as well as some urge incontinence.  Erectile function: SHIM score is 1.     ALLERGIES: No Allergies    MEDICATIONS: Aspirin 81 mg tablet,chewable  Simvastatin  Lexapro  Melatonin  Multivitamin  Nitroglycerin  Ramipril  Silodosin 4 mg capsule 1 capsule PO Daily  Solifenacin Succinate 5 mg tablet 1 tablet PO Daily  Testosterone Gel  Vitamin D3     GU PSH: Complex cystometrogram, w/ void pressure and urethral pressure profile studies, any technique - 07/24/2020 Complex Uroflow - 07/24/2020, 04/22/2020 Cystoscopy - 10/07/2020 Emg surf Electrd - 07/24/2020 Inject For cystogram - 07/24/2020 Intrabd voidng Press - 07/24/2020 Prostate Needle Biopsy - 12/13/2020       PSH Notes: Tonsillectomy  Cardiac ablation for Afib   NON-GU PSH: Cardiac Stent Placement Remove Tonsils - 2011 Surgical Pathology, Gross And Microscopic Examination For Prostate Needle - 12/13/2020     GU PMH: BPH w/LUTS - 12/25/2020, His prostate is only 13ml and he has some bladder neck tightness on cystoscopy but no lateral lobe hyperplasia. He would not be a candidate for minimally invasive therapy but might benefit from  from a TUIP but his seminal vesicles are abnormal on [place] and that needs further evaluation with an MRI to determine if a biopsy is needed. , - 10/07/2020, He has moderate LUTS with an initial PVR of 171ml and 29ml with primarily urgency and nocturia with some UUI. I discussed cutting out the tea and diet sodas but he doesn't want to do that entirely. I will start him on tamsulosin and have him return in a month for a flowrate and PVR. If  he doesn't improve, we could consider urodynamics and cystoscopy. , - 03/25/2020 ED due to arterial insufficiency (Stable), He has an IPSS of 0 so preservation of erectile function is not a concern. - 12/25/2020, - 03/25/2020 Nocturia - 12/25/2020, - 10/07/2020, - 03/25/2020 Prostate Cancer, He has T1c N0 Mx GG3 unfavorable intermediate risk prostate cancer with 12/17 cores positive. He has preexisting ED and moderate LUTS with a small prostate of about 72ml. I discuss treatment options and don't believe he is a good candidate for AS. I discussed RALP, XRT, Seeds, Cryo and HIFU. With his small prostate I don't think he would be a good candidate for seeds and I didn't feel Cryo or HIFU would be best for him. I reviewed the risks and benefits of RALP and XRT in detail and at this time he is most interested in RALP. I will get him set up to see Dr. Alinda Money. I have also ordered a bone scan to complete his staging since he has high volume disease in a small prostate with GG3 unfavorable intermediate risk disease. - 12/25/2020 Detrusor overactivity - 10/07/2020 Elevated PSA, He has had a prior elevated PSA and the most recent was 2.02 which is high for his prostate volume. I will repeat a PSA today. - 10/07/2020, Elevated prostate specific antigen (PSA), - 2014 Urge incontinence, He has some instability on urodynamics and I will add solifenacin 5mg  to the silodosin to see if that will reduce some of his irritative voiding symtoms. - 10/07/2020, - 03/25/2020 Incomplete bladder emptying - 07/29/2020 Urinary Urgency - 03/25/2020 Primary hypogonadism, Hypogonadism, testicular - 2014      PMH Notes:  2009-12-16 11:04:15 - Note: Serum Enzyme Levels - ALT (SGPT) Elevated   NON-GU PMH: Low testosterone, He is on testosterone therapy and I didn't feel we should stop that at this time since he is very physically active and I don't want to impact his quality of life by making him hypogonadal at this time. - 12/25/2020 Abnormal  findings on diagnostic imaging of other specified body structures - 12/13/2020, The seminal vesicles have thickened walls. I will get an MRI to assess along with the PSA. , - 10/07/2020 Personal history of other endocrine, nutritional and metabolic disease, History of hyperglycemia - 2014 Atrial Fibrillation Encounter for general adult medical examination without abnormal findings, Encounter for preventive health examination    FAMILY HISTORY: 3 Son's - Other Cancer - Mother Family Health Status Number - Runs In Family   SOCIAL HISTORY: Marital Status: Married Preferred Language: English; Race: White Current Smoking Status: Patient has never smoked.   Tobacco Use Assessment Completed: Used Tobacco in last 30 days? Drinks 2 caffeinated drinks per day. Patient's occupation is/was Retired.     Notes: Caffeine Use, Alcohol Use, Tobacco Use   REVIEW OF SYSTEMS:    GU Review Male:   Patient denies frequent urination, hard to postpone urination, burning/ pain with urination, get up at night to urinate, leakage of urine, stream starts and stops, trouble  starting your streams, and have to strain to urinate .  Gastrointestinal (Lower):   Patient denies diarrhea and constipation.  Gastrointestinal (Upper):   Patient denies nausea and vomiting.  Constitutional:   Patient denies fever, night sweats, weight loss, and fatigue.  Skin:   Patient denies skin rash/ lesion and itching.  Eyes:   Patient denies blurred vision and double vision.  Ears/ Nose/ Throat:   Patient denies sore throat and sinus problems.  Hematologic/Lymphatic:   Patient denies swollen glands and easy bruising.  Cardiovascular:   Patient denies leg swelling and chest pains.  Respiratory:   Patient denies cough and shortness of breath.  Endocrine:   Patient denies excessive thirst.  Musculoskeletal:   Patient denies back pain and joint pain.  Neurological:   Patient denies headaches and dizziness.  Psychologic:   Patient denies  depression and anxiety.   VITAL SIGNS:       Weight 125 lb / 56.7 kg  Height 65.5 in / 166.37 cm  BMI 20.5 kg/m     MULTI-SYSTEM PHYSICAL EXAMINATION:    Constitutional: Well-nourished. No physical deformities. Normally developed. Good grooming.  Neck: Neck symmetrical, not swollen. Normal tracheal position.  Respiratory: No labored breathing, no use of accessory muscles. Clear bilaterally.  Cardiovascular: Normal temperature, normal extremity pulses, no swelling, no varicosities. Regular rate and rhythm.  Lymphatic: No enlargement of neck, axillae, groin.  Skin: No paleness, no jaundice, no cyanosis. No lesion, no ulcer, no rash.  Neurologic / Psychiatric: Oriented to time, oriented to place, oriented to person. No depression, no anxiety, no agitation.  Gastrointestinal: No mass, no tenderness, no rigidity, non obese abdomen.  Eyes: Normal conjunctivae. Normal eyelids.  Ears, Nose, Mouth, and Throat: Left ear no scars, no lesions, no masses. Right ear no scars, no lesions, no masses. Nose no scars, no lesions, no masses. Normal hearing. Normal lips.  Musculoskeletal: Normal gait and station of head and neck.     Complexity of Data:  Lab Test Review:   PSA  Records Review:   Pathology Reports, Previous Patient Records  X-Ray Review: MRI Prostate GSORAD: Reviewed Films.  Bone Scan: Reviewed Films.     10/07/20 12/23/09  PSA  Total PSA 0.72 ng/mL 0.37   Free PSA  <0.07   % Free PSA  18.9    Notes:                     CLINICAL DATA: Prostate cancer.   EXAM:  NUCLEAR MEDICINE WHOLE BODY BONE SCAN   TECHNIQUE:  Whole body anterior and posterior images were obtained approximately  3 hours after intravenous injection of radiopharmaceutical.   RADIOPHARMACEUTICALS: 21.2 mCi Technetium-69m MDP IV   COMPARISON: None.   FINDINGS:  Abnormal uptake is seen involving both knees as well as both first  toes most consistent with degenerative change. Focus of abnormal  uptake is  seen involving the left side of the lumbar spine at the  L2-3 level which most likely represents degenerative change given  the degree of scoliosis present. No other areas of abnormal uptake  are noted.   IMPRESSION:  Abnormal uptake is seen involving knees and feet most consistent  with degenerative change.   Focus of abnormal uptake is noted along the left side of the lumbar  spine at the L2-3 level which may represent degenerative change  given the degree of scoliosis present. Radiographs are recommended  for confirmation.   No definite scintigraphic evidence of osseous metastases is  noted.    Electronically Signed  By: Marijo Conception M.D.  On: 01/02/2021 08:09   CLINICAL DATA: Elevated PSA, no history of cancer. No prior history  of biopsy.   EXAM:  MR PROSTATE WITHOUT AND WITH CONTRAST   TECHNIQUE:  Multiplanar multisequence MRI images were obtained of the pelvis  centered about the prostate. Pre and post contrast images were  obtained.   CONTRAST: 80mL MULTIHANCE GADOBENATE DIMEGLUMINE 529 MG/ML IV SOLN   COMPARISON: None   FINDINGS:  Prostate:   Peripheral zone: 12 x 4 mm area of marked decreased signal on T2  corresponding to dark area on ADC and restricted diffusion on high B  value images. (Image 12, series 6) PIRADS category 4, this is  located in the RIGHT posterolateral mid to apical prostate. This  area also shows more pronounced post-contrast enhancement than other  areas of the peripheral zone.   Another focal area of enhancement in the LEFT prostate base in the  posterolateral peripheral zone (image 24, series 9) this shows focal  T2 hypointensity, diffusion imaging is limited by the presence of  rectal gas. PIRADS category 3.   Assessment limited by motion artifact and due to the presence of  rectal gas in other areas but with linear and wedge-shaped areas of  T2 hypointensity elsewhere in the peripheral zone that are  compatible with prior  prostatitis.   Transitional zone: Mild expansion of the transitional zone  compatible with BPH. Motion limited assessment without high-risk  lesion.   Volume: 15.2 cc   Transcapsular spread: Absent   Seminal vesicle involvement: Absent   Neurovascular bundle involvement: Absent   Pelvic adenopathy: Absent   Bone metastasis: Absent   Other findings: 2 calcifications in the LEFT seminal vesicle,  largest approximately 9 mm. Some proteinaceous debris in the RIGHT  seminal vesicle.   Abundant stool and gas in the rectum does limit assessment of the  upper gland.   IMPRESSION:  1. PIRADS category 4 lesion in the RIGHT posterolateral mid to  apical prostate.  2. PIRADS category 3 lesion in the LEFT posterolateral peripheral  zone near the base of the LEFT prostate.  3. No evidence of extracapsular extension, seminal vesicle invasion,  or metastatic disease.  4. Signs of debris in the RIGHT seminal vesicle and calculi in the  LEFT seminal vesicle as described.  5. Abundant stool and gas throughout the visible colon. Some dilated  small bowel loops in the lower abdomen, correlate with constipation  or signs of abdominal pain.   Electronically Signed:  By: Zetta Bills M.D.  On: 11/01/2020 17:45   ADDENDUM REPORT: 11/01/2020 18:07   ADDENDUM:  Signs of small bowel dilation are noted in the lower abdomen and  there is a large volume of stool in the visualized colon with marked  distension of the cecum as well. Stool and gas seen in the rectum.  Findings are most suggestive of constipation. Presence of small  bowel dilation on today's exam could certainly be seen with  constipation but would correlate with any symptoms of abdominal  pain.   These results will be called to the ordering clinician or  representative by the Radiologist Assistant, and communication  documented in the PACS or [employer].    Electronically Signed  By: Zetta Bills M.D.  On:  11/01/2020 18:07       ASSESSMENT:      ICD-10 Details  1 GU:   Prostate Cancer - C61   2  Urge incontinence - N39.41    PLAN:      1. Unfavorable intermediate risk prostate cancer: I had a detailed discussion with Patrick Barnes and his wife (who is an Therapist, sports) regarding his prostate cancer situation. I did recommend therapy of curative intent considering his disease parameters and life expectancy. The patient was counseled about the natural history of prostate cancer and the standard treatment options that are available for prostate cancer. It was explained to him how his age and life expectancy, clinical stage, Gleason score, and PSA affect his prognosis, the decision to proceed with additional staging studies, as well as how that information influences recommended treatment strategies. We discussed the roles for active surveillance, radiation therapy, surgical therapy, androgen deprivation, as well as ablative therapy options for the treatment of prostate cancer as appropriate to his individual cancer situation. We discussed the risks and benefits of these options with regard to their impact on cancer control and also in terms of potential adverse events, complications, and impact on quality of life particularly related to urinary and sexual function. The patient was encouraged to ask questions throughout the discussion today and all questions were answered to his stated satisfaction. In addition, the patient was provided with and/or directed to appropriate resources and literature for further education about prostate cancer and treatment options. We discussed surgical therapy for prostate cancer including the different available surgical approaches. We discussed, in detail, the risks and expectations of surgery with regard to cancer control, urinary control, and erectile function as well as the expected postoperative recovery process. Additional risks of surgery including but not limited to bleeding,  infection, hernia formation, nerve damage, lymphocele formation, bowel/rectal injury potentially necessitating colostomy, damage to the urinary tract resulting in urine leakage, urethral stricture, and the cardiopulmonary risks such as myocardial infarction, stroke, death, venothromboembolism, etc. were explained. The risk of open surgical conversion for robotic/laparoscopic prostatectomy was also discussed.   After discussion, he does adamantly wished to proceed with surgical treatment. I did offer him a radiation oncology consultation which he currently declines. He feels well informed. I will plan to obtain cardiac clearance from his cardiologist considering his past history of coronary artery disease and cardiac stent placement. He will remain on aspirin 81 mg perioperatively. He will be tentatively scheduled for a robot assisted laparoscopic radical prostatectomy and bilateral pelvic lymphadenectomy. Considering his pre-existing erectile dysfunction and high-risk disease, I will not plan to perform aggressive nerve-sparing procedure although he likely does not require a wide resection considering his disease parameters and exam/MRI.

## 2021-02-10 ENCOUNTER — Encounter (HOSPITAL_COMMUNITY)
Admission: RE | Disposition: A | Discharge status: Home / Self Care | Payer: Self-pay | Source: Other Acute Inpatient Hospital | Attending: Urology

## 2021-02-10 ENCOUNTER — Encounter (HOSPITAL_COMMUNITY): Payer: Self-pay | Admitting: Urology

## 2021-02-10 ENCOUNTER — Ambulatory Visit (HOSPITAL_COMMUNITY): Payer: Medicare Other | Admitting: Certified Registered Nurse Anesthetist

## 2021-02-10 ENCOUNTER — Other Ambulatory Visit: Payer: Self-pay

## 2021-02-10 ENCOUNTER — Ambulatory Visit (HOSPITAL_COMMUNITY): Payer: Medicare Other | Admitting: Physician Assistant

## 2021-02-10 ENCOUNTER — Observation Stay (HOSPITAL_COMMUNITY)
Admission: RE | Admit: 2021-02-10 | Discharge: 2021-02-11 | Disposition: A | Discharge status: Home / Self Care | Payer: Medicare Other | Source: Other Acute Inpatient Hospital | Attending: Urology | Admitting: Urology

## 2021-02-10 DIAGNOSIS — Z955 Presence of coronary angioplasty implant and graft: Secondary | ICD-10-CM | POA: Insufficient documentation

## 2021-02-10 DIAGNOSIS — I4510 Unspecified right bundle-branch block: Secondary | ICD-10-CM | POA: Diagnosis not present

## 2021-02-10 DIAGNOSIS — I2510 Atherosclerotic heart disease of native coronary artery without angina pectoris: Secondary | ICD-10-CM | POA: Insufficient documentation

## 2021-02-10 DIAGNOSIS — Z7982 Long term (current) use of aspirin: Secondary | ICD-10-CM | POA: Insufficient documentation

## 2021-02-10 DIAGNOSIS — C61 Malignant neoplasm of prostate: Secondary | ICD-10-CM | POA: Diagnosis not present

## 2021-02-10 DIAGNOSIS — E785 Hyperlipidemia, unspecified: Secondary | ICD-10-CM | POA: Diagnosis not present

## 2021-02-10 DIAGNOSIS — Z79899 Other long term (current) drug therapy: Secondary | ICD-10-CM | POA: Diagnosis not present

## 2021-02-10 DIAGNOSIS — E559 Vitamin D deficiency, unspecified: Secondary | ICD-10-CM | POA: Diagnosis not present

## 2021-02-10 HISTORY — PX: LYMPHADENECTOMY: SHX5960

## 2021-02-10 HISTORY — PX: ROBOT ASSISTED LAPAROSCOPIC RADICAL PROSTATECTOMY: SHX5141

## 2021-02-10 LAB — TYPE AND SCREEN
ABO/RH(D): A POS
Antibody Screen: NEGATIVE

## 2021-02-10 LAB — HEMOGLOBIN AND HEMATOCRIT, BLOOD
HCT: 40.7 % (ref 39.0–52.0)
Hemoglobin: 13.1 g/dL (ref 13.0–17.0)

## 2021-02-10 LAB — ABO/RH: ABO/RH(D): A POS

## 2021-02-10 SURGERY — XI ROBOTIC ASSISTED LAPAROSCOPIC RADICAL PROSTATECTOMY LEVEL 2
Anesthesia: General

## 2021-02-10 MED ORDER — MAGNESIUM CITRATE PO SOLN
1.0000 | Freq: Once | ORAL | Status: DC
  Filled 2021-02-10: qty 296

## 2021-02-10 MED ORDER — HYDROMORPHONE HCL 1 MG/ML IJ SOLN: 0.2500 mg | INTRAMUSCULAR | Status: DC | PRN

## 2021-02-10 MED ORDER — ROCURONIUM BROMIDE 10 MG/ML (PF) SYRINGE
PREFILLED_SYRINGE | INTRAVENOUS | Status: DC | PRN
  Administered 2021-02-10: 20 mg via INTRAVENOUS
  Administered 2021-02-10: 60 mg via INTRAVENOUS

## 2021-02-10 MED ORDER — BUPIVACAINE-EPINEPHRINE (PF) 0.25% -1:200000 IJ SOLN
INTRAMUSCULAR | Status: AC
  Filled 2021-02-10: qty 30

## 2021-02-10 MED ORDER — HEPARIN SODIUM (PORCINE) 1000 UNIT/ML IJ SOLN
INTRAMUSCULAR | Status: AC
  Filled 2021-02-10: qty 1

## 2021-02-10 MED ORDER — LACTATED RINGERS IV SOLN: INTRAVENOUS | Status: DC | PRN

## 2021-02-10 MED ORDER — BELLADONNA ALKALOIDS-OPIUM 16.2-60 MG RE SUPP: 1.0000 | Freq: Four times a day (QID) | RECTAL | Status: DC | PRN

## 2021-02-10 MED ORDER — OXYCODONE HCL 5 MG/5ML PO SOLN: 5.0000 mg | Freq: Once | ORAL | Status: DC | PRN

## 2021-02-10 MED ORDER — DOCUSATE SODIUM 100 MG PO CAPS
100.0000 mg | ORAL_CAPSULE | Freq: Two times a day (BID) | ORAL | Status: DC
  Administered 2021-02-10 – 2021-02-11 (×2): 100 mg via ORAL
  Filled 2021-02-10 (×2): qty 1

## 2021-02-10 MED ORDER — OXYCODONE HCL 5 MG PO TABS
5.0000 mg | ORAL_TABLET | Freq: Once | ORAL | Status: DC | PRN
Start: 2021-02-10 — End: 2021-02-10

## 2021-02-10 MED ORDER — MIDAZOLAM HCL 5 MG/5ML IJ SOLN
INTRAMUSCULAR | Status: DC | PRN
  Administered 2021-02-10: 2 mg via INTRAVENOUS

## 2021-02-10 MED ORDER — SODIUM CHLORIDE 0.9 % IR SOLN
Status: DC | PRN
  Administered 2021-02-10: 1000 mL

## 2021-02-10 MED ORDER — GLYCOPYRROLATE PF 0.2 MG/ML IJ SOSY
PREFILLED_SYRINGE | INTRAMUSCULAR | Status: DC | PRN
  Administered 2021-02-10: .2 mg via INTRAVENOUS

## 2021-02-10 MED ORDER — PROMETHAZINE HCL 25 MG/ML IJ SOLN: 6.2500 mg | INTRAMUSCULAR | Status: DC | PRN

## 2021-02-10 MED ORDER — DIPHENHYDRAMINE HCL 12.5 MG/5ML PO ELIX: 12.5000 mg | ORAL_SOLUTION | Freq: Four times a day (QID) | ORAL | Status: DC | PRN

## 2021-02-10 MED ORDER — DEXAMETHASONE SODIUM PHOSPHATE 4 MG/ML IJ SOLN: INTRAMUSCULAR | Status: DC | PRN

## 2021-02-10 MED ORDER — STERILE WATER FOR IRRIGATION IR SOLN
Status: DC | PRN
  Administered 2021-02-10: 1000 mL

## 2021-02-10 MED ORDER — MORPHINE SULFATE (PF) 2 MG/ML IV SOLN: 2.0000 mg | INTRAVENOUS | Status: DC | PRN

## 2021-02-10 MED ORDER — CEFAZOLIN SODIUM-DEXTROSE 2-4 GM/100ML-% IV SOLN
2.0000 g | INTRAVENOUS | Status: AC
  Administered 2021-02-10: 2 g via INTRAVENOUS
  Filled 2021-02-10: qty 100

## 2021-02-10 MED ORDER — TRAMADOL HCL 50 MG PO TABS: 50.0000 mg | ORAL_TABLET | Freq: Four times a day (QID) | ORAL | 0 refills | Status: DC | PRN

## 2021-02-10 MED ORDER — CHLORHEXIDINE GLUCONATE CLOTH 2 % EX PADS
6.0000 | MEDICATED_PAD | Freq: Every day | CUTANEOUS | Status: DC
  Administered 2021-02-10 – 2021-02-11 (×2): 6 via TOPICAL

## 2021-02-10 MED ORDER — ONDANSETRON HCL 4 MG/2ML IJ SOLN
INTRAMUSCULAR | Status: DC | PRN
  Administered 2021-02-10: 4 mg via INTRAVENOUS

## 2021-02-10 MED ORDER — PROPOFOL 500 MG/50ML IV EMUL
INTRAVENOUS | Status: AC
  Filled 2021-02-10: qty 50

## 2021-02-10 MED ORDER — FENTANYL CITRATE (PF) 250 MCG/5ML IJ SOLN
INTRAMUSCULAR | Status: AC
  Filled 2021-02-10: qty 5

## 2021-02-10 MED ORDER — LIDOCAINE 2% (20 MG/ML) 5 ML SYRINGE
INTRAMUSCULAR | Status: DC | PRN
  Administered 2021-02-10: 40 mg via INTRAVENOUS

## 2021-02-10 MED ORDER — CHLORHEXIDINE GLUCONATE 0.12 % MT SOLN: 15.0000 mL | Freq: Once | OROMUCOSAL | Status: AC

## 2021-02-10 MED ORDER — ONDANSETRON HCL 4 MG/2ML IJ SOLN: 4.0000 mg | INTRAMUSCULAR | Status: DC | PRN

## 2021-02-10 MED ORDER — SODIUM CHLORIDE 0.9 % IV BOLUS
1000.0000 mL | Freq: Once | INTRAVENOUS | Status: AC
  Administered 2021-02-10: 1000 mL via INTRAVENOUS

## 2021-02-10 MED ORDER — KETOROLAC TROMETHAMINE 15 MG/ML IJ SOLN
15.0000 mg | Freq: Four times a day (QID) | INTRAMUSCULAR | Status: DC
  Administered 2021-02-10 – 2021-02-11 (×4): 15 mg via INTRAVENOUS
  Filled 2021-02-10 (×4): qty 1

## 2021-02-10 MED ORDER — RAMIPRIL 2.5 MG PO CAPS
2.5000 mg | ORAL_CAPSULE | Freq: Every day | ORAL | Status: DC
  Administered 2021-02-10 – 2021-02-11 (×2): 2.5 mg via ORAL
  Filled 2021-02-10 (×2): qty 1

## 2021-02-10 MED ORDER — ORAL CARE MOUTH RINSE
15.0000 mL | Freq: Once | OROMUCOSAL | Status: AC
  Administered 2021-02-10: 15 mL via OROMUCOSAL

## 2021-02-10 MED ORDER — KCL IN DEXTROSE-NACL 20-5-0.45 MEQ/L-%-% IV SOLN
INTRAVENOUS | Status: DC
  Filled 2021-02-10 (×3): qty 1000

## 2021-02-10 MED ORDER — SUGAMMADEX SODIUM 200 MG/2ML IV SOLN
INTRAVENOUS | Status: DC | PRN
  Administered 2021-02-10: 200 mg via INTRAVENOUS

## 2021-02-10 MED ORDER — FENTANYL CITRATE (PF) 100 MCG/2ML IJ SOLN
INTRAMUSCULAR | Status: DC | PRN
  Administered 2021-02-10: 100 ug via INTRAVENOUS
  Administered 2021-02-10 (×2): 50 ug via INTRAVENOUS

## 2021-02-10 MED ORDER — LACTATED RINGERS IV SOLN: INTRAVENOUS | Status: DC

## 2021-02-10 MED ORDER — PROPOFOL 10 MG/ML IV BOLUS
INTRAVENOUS | Status: DC | PRN
  Administered 2021-02-10: 140 mg via INTRAVENOUS

## 2021-02-10 MED ORDER — ONDANSETRON HCL 4 MG/2ML IJ SOLN
INTRAMUSCULAR | Status: AC
  Filled 2021-02-10: qty 2

## 2021-02-10 MED ORDER — ROCURONIUM BROMIDE 10 MG/ML (PF) SYRINGE
PREFILLED_SYRINGE | INTRAVENOUS | Status: AC
  Filled 2021-02-10: qty 10

## 2021-02-10 MED ORDER — DIPHENHYDRAMINE HCL 50 MG/ML IJ SOLN: 12.5000 mg | Freq: Four times a day (QID) | INTRAMUSCULAR | Status: DC | PRN

## 2021-02-10 MED ORDER — SULFAMETHOXAZOLE-TRIMETHOPRIM 800-160 MG PO TABS: 1.0000 | ORAL_TABLET | Freq: Two times a day (BID) | ORAL | 0 refills | Status: DC

## 2021-02-10 MED ORDER — ACETAMINOPHEN 325 MG PO TABS
650.0000 mg | ORAL_TABLET | ORAL | Status: DC | PRN
  Administered 2021-02-11: 650 mg via ORAL
  Filled 2021-02-10: qty 2

## 2021-02-10 MED ORDER — ZOLPIDEM TARTRATE 5 MG PO TABS: 5.0000 mg | ORAL_TABLET | Freq: Every evening | ORAL | Status: DC | PRN

## 2021-02-10 MED ORDER — CEFAZOLIN SODIUM-DEXTROSE 1-4 GM/50ML-% IV SOLN
1.0000 g | Freq: Three times a day (TID) | INTRAVENOUS | Status: AC
  Administered 2021-02-10 (×2): 1 g via INTRAVENOUS
  Filled 2021-02-10 (×2): qty 50

## 2021-02-10 MED ORDER — AMISULPRIDE (ANTIEMETIC) 5 MG/2ML IV SOLN: 10.0000 mg | Freq: Once | INTRAVENOUS | Status: DC | PRN

## 2021-02-10 MED ORDER — MIDAZOLAM HCL 2 MG/2ML IJ SOLN
INTRAMUSCULAR | Status: AC
  Filled 2021-02-10: qty 2

## 2021-02-10 MED ORDER — BUPIVACAINE-EPINEPHRINE 0.25% -1:200000 IJ SOLN
INTRAMUSCULAR | Status: DC | PRN
  Administered 2021-02-10: 30 mL

## 2021-02-10 MED ORDER — FLEET ENEMA 7-19 GM/118ML RE ENEM: 1.0000 | ENEMA | Freq: Once | RECTAL | Status: DC

## 2021-02-10 MED ORDER — NITROGLYCERIN 0.4 MG SL SUBL: 0.4000 mg | SUBLINGUAL_TABLET | SUBLINGUAL | Status: DC | PRN

## 2021-02-10 MED ORDER — LIDOCAINE 2% (20 MG/ML) 5 ML SYRINGE
INTRAMUSCULAR | Status: AC
  Filled 2021-02-10: qty 5

## 2021-02-10 MED ORDER — ESCITALOPRAM OXALATE 10 MG PO TABS
10.0000 mg | ORAL_TABLET | Freq: Every day | ORAL | Status: DC
  Administered 2021-02-10 – 2021-02-11 (×2): 10 mg via ORAL
  Filled 2021-02-10 (×2): qty 1

## 2021-02-10 MED ORDER — BACITRACIN-NEOMYCIN-POLYMYXIN 400-5-5000 EX OINT: 1.0000 "application " | TOPICAL_OINTMENT | Freq: Three times a day (TID) | CUTANEOUS | Status: DC | PRN

## 2021-02-10 MED ORDER — SIMVASTATIN 40 MG PO TABS
40.0000 mg | ORAL_TABLET | Freq: Every day | ORAL | Status: DC
  Administered 2021-02-10 – 2021-02-11 (×2): 40 mg via ORAL
  Filled 2021-02-10 (×2): qty 1

## 2021-02-10 SURGICAL SUPPLY — 62 items
ADH SKN CLS APL DERMABOND .7 (GAUZE/BANDAGES/DRESSINGS) ×1
APL PRP STRL LF DISP 70% ISPRP (MISCELLANEOUS) ×2
APL SWBSTK 6 STRL LF DISP (MISCELLANEOUS) ×2
APPLICATOR COTTON TIP 6 STRL (MISCELLANEOUS) ×2 IMPLANT
APPLICATOR COTTON TIP 6IN STRL (MISCELLANEOUS) ×3
CATH FOLEY 2WAY SLVR 18FR 30CC (CATHETERS) ×3 IMPLANT
CATH ROBINSON RED A/P 16FR (CATHETERS) ×3 IMPLANT
CATH ROBINSON RED A/P 8FR (CATHETERS) ×3 IMPLANT
CATH TIEMANN FOLEY 18FR 5CC (CATHETERS) ×3 IMPLANT
CHLORAPREP W/TINT 26 (MISCELLANEOUS) ×3 IMPLANT
CLIP VESOLOCK LG 6/CT PURPLE (CLIP) ×6 IMPLANT
COVER SURGICAL LIGHT HANDLE (MISCELLANEOUS) ×3 IMPLANT
COVER TIP SHEARS 8 DVNC (MISCELLANEOUS) ×2 IMPLANT
COVER TIP SHEARS 8MM DA VINCI (MISCELLANEOUS) ×3
COVER WAND RF STERILE (DRAPES) IMPLANT
CUTTER ECHEON FLEX ENDO 45 340 (ENDOMECHANICALS) ×3 IMPLANT
DECANTER SPIKE VIAL GLASS SM (MISCELLANEOUS) ×3 IMPLANT
DERMABOND ADVANCED (GAUZE/BANDAGES/DRESSINGS) ×1
DERMABOND ADVANCED .7 DNX12 (GAUZE/BANDAGES/DRESSINGS) ×2 IMPLANT
DRAIN CHANNEL RND F F (WOUND CARE) IMPLANT
DRAPE ARM DVNC X/XI (DISPOSABLE) ×8 IMPLANT
DRAPE COLUMN DVNC XI (DISPOSABLE) ×2 IMPLANT
DRAPE DA VINCI XI ARM (DISPOSABLE) ×12
DRAPE DA VINCI XI COLUMN (DISPOSABLE) ×3
DRAPE SURG IRRIG POUCH 19X23 (DRAPES) ×3 IMPLANT
DRSG TEGADERM 4X4.75 (GAUZE/BANDAGES/DRESSINGS) ×3 IMPLANT
ELECT PENCIL ROCKER SW 15FT (MISCELLANEOUS) ×3 IMPLANT
ELECT REM PT RETURN 15FT ADLT (MISCELLANEOUS) ×3 IMPLANT
GLOVE SURG ENC MOIS LTX SZ6.5 (GLOVE) ×12 IMPLANT
GLOVE SURG ENC TEXT LTX SZ7.5 (GLOVE) ×6 IMPLANT
GOWN STRL REUS W/TWL LRG LVL3 (GOWN DISPOSABLE) ×15 IMPLANT
GOWN STRL REUS W/TWL XL LVL3 (GOWN DISPOSABLE) ×3 IMPLANT
HOLDER FOLEY CATH W/STRAP (MISCELLANEOUS) ×3 IMPLANT
IRRIG SUCT STRYKERFLOW 2 WTIP (MISCELLANEOUS) ×3
IRRIGATION SUCT STRKRFLW 2 WTP (MISCELLANEOUS) ×2 IMPLANT
IV LACTATED RINGERS 1000ML (IV SOLUTION) ×3 IMPLANT
KIT TURNOVER KIT A (KITS) ×3 IMPLANT
NDL SAFETY ECLIPSE 18X1.5 (NEEDLE) ×2 IMPLANT
NEEDLE HYPO 18GX1.5 SHARP (NEEDLE) ×3
PACK ROBOT UROLOGY CUSTOM (CUSTOM PROCEDURE TRAY) ×3 IMPLANT
PENCIL SMOKE EVACUATOR (MISCELLANEOUS) IMPLANT
SEAL CANN UNIV 5-8 DVNC XI (MISCELLANEOUS) ×8 IMPLANT
SEAL XI 5MM-8MM UNIVERSAL (MISCELLANEOUS) ×12
SET TUBE SMOKE EVAC HIGH FLOW (TUBING) ×3 IMPLANT
SOLUTION ELECTROLUBE (MISCELLANEOUS) ×3 IMPLANT
STAPLE RELOAD 45 GRN (STAPLE) ×2 IMPLANT
STAPLE RELOAD 45MM GREEN (STAPLE) ×3
SUT ETHILON 3 0 PS 1 (SUTURE) ×3 IMPLANT
SUT MNCRL 3 0 RB1 (SUTURE) ×2 IMPLANT
SUT MNCRL 3 0 VIOLET RB1 (SUTURE) ×2 IMPLANT
SUT MNCRL AB 4-0 PS2 18 (SUTURE) ×6 IMPLANT
SUT MONOCRYL 3 0 RB1 (SUTURE) ×6
SUT VIC AB 0 CT1 27 (SUTURE) ×3
SUT VIC AB 0 CT1 27XBRD ANTBC (SUTURE) ×2 IMPLANT
SUT VIC AB 0 UR5 27 (SUTURE) ×3 IMPLANT
SUT VIC AB 2-0 SH 27 (SUTURE) ×3
SUT VIC AB 2-0 SH 27X BRD (SUTURE) ×2 IMPLANT
SUT VICRYL 0 UR6 27IN ABS (SUTURE) ×6 IMPLANT
SYR 27GX1/2 1ML LL SAFETY (SYRINGE) ×3 IMPLANT
TOWEL OR NON WOVEN STRL DISP B (DISPOSABLE) ×3 IMPLANT
TROCAR XCEL NON-BLD 5MMX100MML (ENDOMECHANICALS) IMPLANT
WATER STERILE IRR 1000ML POUR (IV SOLUTION) ×3 IMPLANT

## 2021-02-10 NOTE — Op Note (Signed)
Preoperative diagnosis: Clinically localized adenocarcinoma of the prostate (clinical stage T1c N0 M0)  Postoperative diagnosis: Clinically localized adenocarcinoma of the prostate (clinical stage T1c N0 M0)  Procedure:  1. Robotic assisted laparoscopic radical prostatectomy (bilateral nerve sparing) 2. Bilateral robotic assisted laparoscopic pelvic lymphadenectomy  Surgeon: Pryor Curia. M.D.  Assistant: Debbrah Alar, PA-C  An assistant was required for this surgical procedure.  The duties of the assistant included but were not limited to suctioning, passing suture, camera manipulation, retraction. This procedure would not be able to be performed without an Environmental consultant.  Resident: Dr. Celene Squibb  Anesthesia: General  Complications: None  EBL: 50 mL  IVF:  2000 mL crystalloid  Specimens: 1. Prostate and seminal vesicles 2. Right pelvic lymph nodes 3. Left pelvic lymph nodes  Disposition of specimens: Pathology  Drains: 1. 20 Fr coude catheter 2. # 19 Blake pelvic drain  Indication: Patrick Barnes is a 70 y.o. year old patient with clinically localized prostate cancer.  After a thorough review of the management options for treatment of prostate cancer, he elected to proceed with surgical therapy and the above procedure(s).  We have discussed the potential benefits and risks of the procedure, side effects of the proposed treatment, the likelihood of the patient achieving the goals of the procedure, and any potential problems that might occur during the procedure or recuperation. Informed consent has been obtained.  Description of procedure:  The patient was taken to the operating room and a general anesthetic was administered. He was given preoperative antibiotics, placed in the dorsal lithotomy position, and prepped and draped in the usual sterile fashion. Next a preoperative timeout was performed. A urethral catheter was placed into the bladder and a site was  selected near the umbilicus for placement of the camera port. This was placed using a standard open Hassan technique which allowed entry into the peritoneal cavity under direct vision and without difficulty. An 8 mm robotic port was placed and a pneumoperitoneum established. The camera was then used to inspect the abdomen and there was no evidence of any intra-abdominal injuries or other abnormalities. The remaining abdominal ports were then placed. 8 mm robotic ports were placed in the right lower quadrant, left lower quadrant, and far left lateral abdominal wall. A 5 mm port was placed in the right upper quadrant and a 12 mm port was placed in the right lateral abdominal wall for laparoscopic assistance. All ports were placed under direct vision without difficulty. The surgical cart was then docked.   Utilizing the cautery scissors, the bladder was reflected posteriorly allowing entry into the space of Retzius and identification of the endopelvic fascia and prostate. The periprostatic fat was then removed from the prostate allowing full exposure of the endopelvic fascia. The endopelvic fascia was then incised from the apex back to the base of the prostate bilaterally and the underlying levator muscle fibers were swept laterally off the prostate thereby isolating the dorsal venous complex. The dorsal vein was then stapled and divided with a 45 mm Flex Echelon stapler. Attention then turned to the bladder neck which was divided anteriorly thereby allowing entry into the bladder and exposure of the urethral catheter. The catheter balloon was deflated and the catheter was brought into the operative field and used to retract the prostate anteriorly. The posterior bladder neck was then examined and was divided allowing further dissection between the bladder and prostate posteriorly until the vasa deferentia and seminal vessels were identified. The vasa deferentia were isolated,  divided, and lifted anteriorly. The  seminal vesicles were dissected down to their tips with care to control the seminal vascular arterial blood supply. These structures were then lifted anteriorly and the space between Denonvillier's fascia and the anterior rectum was developed with a combination of sharp and blunt dissection. This isolated the vascular pedicles of the prostate.  The lateral prostatic fascia was then sharply incised allowing release of the neurovascular bundles bilaterally. The vascular pedicles of the prostate were then ligated with Weck clips between the prostate and neurovascular bundles and divided with sharp cold scissor dissection resulting in neurovascular bundle preservation. Some periprostatic fat was left with the prostate to allow a slightly wider margin. The neurovascular bundles were then separated off the apex of the prostate and urethra bilaterally.  The urethra was then sharply transected allowing the prostate specimen to be disarticulated. The pelvis was copiously irrigated and hemostasis was ensured. There was no evidence for rectal injury.  Attention then turned to the right pelvic sidewall. The fibrofatty tissue between the external iliac vein, confluence of the iliac vessels, hypogastric artery, and Cooper's ligament was dissected free from the pelvic sidewall with care to preserve the obturator nerve. Weck clips were used for lymphostasis and hemostasis. An identical procedure was performed on the contralateral side and the lymphatic packets were removed for permanent pathologic analysis.  Attention then turned to the urethral anastomosis. A 2-0 Vicryl slip knot was placed between Denonvillier's fascia, the posterior bladder neck, and the posterior urethra to reapproximate these structures. A double-armed 3-0 Monocryl suture was then used to perform a 360 running tension-free anastomosis between the bladder neck and urethra. A new urethral catheter was then placed into the bladder and irrigated. There  were no blood clots within the bladder and the anastomosis appeared to be watertight. A #19 Blake drain was then brought through the left lateral 8 mm port site and positioned appropriately within the pelvis. It was secured to the skin with a nylon suture. The surgical cart was then undocked. The right lateral 12 mm port site was closed at the fascial level with a 0 Vicryl suture placed laparoscopically. All remaining ports were then removed under direct vision. The prostate specimen was removed intact within the Endopouch retrieval bag via the periumbilical camera port site. This fascial opening was closed with two running 0 Vicryl sutures. 0.25% Marcaine was then injected into all port sites and all incisions were reapproximated at the skin level with 4-0 Monocryl subcuticular sutures and Dermabond. The patient appeared to tolerate the procedure well and without complications. The patient was able to be extubated and transferred to the recovery unit in satisfactory condition.   Pryor Curia MD

## 2021-02-10 NOTE — Progress Notes (Signed)
Post-op note  Subjective: The patient is doing well.  Reports expected incisional pain (R>L) and requests heating pad to help with pain. Tolerating clears without nausea/emesis.   Objective: Vital signs in last 24 hours: Temp:  [95.8 F (35.4 C)-97.9 F (36.6 C)] 97.9 F (36.6 C) (03/14 1152) Pulse Rate:  [40-59] 49 (03/14 1152) Resp:  [10-18] 16 (03/14 1152) BP: (136-164)/(80-96) 151/96 (03/14 1152) SpO2:  [96 %-100 %] 100 % (03/14 1152) Weight:  [54.2 kg] 54.2 kg (03/14 1152)  Intake/Output from previous day: No intake/output data recorded. Intake/Output this shift: Total I/O In: 1150 [I.V.:1000; IV Piggyback:150] Out: 1365 [Urine:1200; Drains:60; Other:55; Blood:50]  Physical Exam:  General: Laying in bed in NAD. Still drowsy from anesthesia Cardiac: Mild bradycardia Pulmonary: NWOB on RA Abdomen: Soft, nondistended. Appropriately tender. Incisions intact and covered with dermabond; no signs of infection. LLQ JP drain with SS output  GU: Foley in place draining yellow urine with sediment in tubing Extremities: Warm and well-perfused   Lab Results: Recent Labs    02/10/21 1034  HGB 13.1  HCT 40.7    Assessment/Plan: POD#0 s/p RALP + BPLND. Overall doing well.   1) Continue to monitor 2) Order heating pads in additional to current analgesics       LOS: 0 days   Celene Squibb 02/10/2021, 2:34 PM

## 2021-02-10 NOTE — Anesthesia Procedure Notes (Signed)
Procedure Name: Intubation Date/Time: 02/10/2021 7:31 AM Performed by: Claudia Desanctis, CRNA Pre-anesthesia Checklist: Patient identified, Emergency Drugs available, Suction available and Patient being monitored Patient Re-evaluated:Patient Re-evaluated prior to induction Oxygen Delivery Method: Circle system utilized Preoxygenation: Pre-oxygenation with 100% oxygen Induction Type: IV induction Ventilation: Mask ventilation without difficulty Laryngoscope Size: 2 and Miller Grade View: Grade I Tube type: Oral Tube size: 7.5 mm Number of attempts: 1 Airway Equipment and Method: Stylet Placement Confirmation: ETT inserted through vocal cords under direct vision,  positive ETCO2 and breath sounds checked- equal and bilateral Secured at: 21 cm Tube secured with: Tape Dental Injury: Teeth and Oropharynx as per pre-operative assessment

## 2021-02-10 NOTE — Discharge Instructions (Signed)
1. Activity:  You are encouraged to ambulate frequently (about every hour during waking hours) to help prevent blood clots from forming in your legs or lungs.  However, you should not engage in any heavy lifting (> 10-15 lbs), strenuous activity, or straining. 2. Diet: You should continue a clear liquid diet until passing gas from below.  Once this occurs, you may advance your diet to a soft diet that would be easy to digest (i.e soups, scrambled eggs, mashed potatoes, etc.) for 24 hours just as you would if getting over a bad stomach flu.  If tolerating this diet well for 24 hours, you may then begin eating regular food.  It will be normal to have some amount of bloating, nausea, and abdominal discomfort intermittently. 3. Prescriptions:  You will be provided a prescription for pain medication to take as needed.  If your pain is not severe enough to require the prescription pain medication, you may take Tylenol instead.  You should also take an over the counter stool softener (Colace 100 mg twice daily) to avoid straining with bowel movements as the pain medication may constipate you. Finally, you will also be provided a prescription for an antibiotic to begin the day prior to your return visit in the office for catheter removal. 4. Catheter care: You will be taught how to take care of the catheter by the nursing staff prior to discharge from the hospital.  You may use both a leg bag and the larger bedside bag but it is recommended to at least use the bigger bedside bag at nighttime as the leg bag is small and will fill up overnight and also does not drain as well when lying flat. You may periodically feel a strong urge to void with the catheter in place.  This is a bladder spasm and most often can occur when having a bowel movement or when you are moving around. It is typically self-limited and usually will stop after a few minutes.  You may use some Vaseline or Neosporin around the tip of the catheter to  reduce friction at the tip of the penis. 5. Incisions: You may remove your dressing bandages the 2nd day after surgery.  You most likely will have a few small staples in each of the incisions and once the bandages are removed, the incisions may stay open to air.  You may start showering (not soaking or bathing in water) 48 hours after surgery and the incisions simply need to be patted dry after the shower.  No additional care is needed. 6. What to call us about: You should call the office [phone]) if you develop fever > 101, persistent vomiting, or the catheter stops draining. Also, feel free to call with any other questions you may have and remember the handout that was provided to you as a reference preoperatively which answers many of the common questions that arise after surgery. 7. You may resume advil, aleve, vitamins, and supplements 7 days after surgery.

## 2021-02-10 NOTE — Progress Notes (Deleted)
Patient ID: Patrick Barnes, male   DOB: [birth year], 70 y.o.   MRN: [id number]  Post-op note  Subjective: The patient is doing well.  No complaints.  Objective: Vital signs in last 24 hours: Temp:  [95.8 F (35.4 C)-97.9 F (36.6 C)] 97.9 F (36.6 C) (03/14 1152) Pulse Rate:  [40-59] 49 (03/14 1152) Resp:  [10-18] 16 (03/14 1152) BP: (136-164)/(80-96) 151/96 (03/14 1152) SpO2:  [96 %-100 %] 100 % (03/14 1152) Weight:  [54.2 kg] 54.2 kg (03/14 1152)  Intake/Output from previous day: No intake/output data recorded. Intake/Output this shift: Total I/O In: 1150 [I.V.:1000; IV Piggyback:150] Out: 1365 [Urine:1200; Drains:60; Other:55; Blood:50]  Physical Exam:  General: Alert and oriented. Abdomen: Soft, Nondistended. Incisions: Clean and dry.  Lab Results: Recent Labs    02/10/21 1034  HGB 13.1  HCT 40.7    Assessment/Plan: POD#0   1) Continue to monitor, ambulate, IS   Pryor Curia. MD   LOS: 0 days   Dutch Gray 02/10/2021, 3:56 PM

## 2021-02-10 NOTE — Transfer of Care (Signed)
Immediate Anesthesia Transfer of Care Note  Patient: Patrick Barnes  Procedure(s) Performed: XI ROBOTIC ASSISTED LAPAROSCOPIC RADICAL PROSTATECTOMY LEVEL 2 (N/A ) LYMPHADENECTOMY, PELVIC (Bilateral )  Patient Location: PACU  Anesthesia Type:General  Level of Consciousness: drowsy  Airway & Oxygen Therapy: Patient Spontanous Breathing and Patient connected to face mask  Post-op Assessment: Report given to RN and Post -op Vital signs reviewed and stable  Post vital signs: Reviewed and stable  Last Vitals:  Vitals Value Taken Time  BP 136/92 02/10/21 1017  Temp    Pulse    Resp 11 02/10/21 1020  SpO2    Vitals shown include unvalidated device data.  Last Pain:  Vitals:   02/10/21 0616  TempSrc: Oral         Complications: No complications documented.

## 2021-02-10 NOTE — Anesthesia Postprocedure Evaluation (Signed)
Anesthesia Post Note  Patient: Patrick Barnes  Procedure(s) Performed: XI ROBOTIC ASSISTED LAPAROSCOPIC RADICAL PROSTATECTOMY LEVEL 2 (N/A ) LYMPHADENECTOMY, PELVIC (Bilateral )     Patient location during evaluation: PACU Anesthesia Type: General Level of consciousness: awake and alert Pain management: pain level controlled Vital Signs Assessment: post-procedure vital signs reviewed and stable Respiratory status: spontaneous breathing, nonlabored ventilation and respiratory function stable Cardiovascular status: blood pressure returned to baseline and stable Postop Assessment: no apparent nausea or vomiting Anesthetic complications: no   No complications documented.  Last Vitals:  Vitals:   02/10/21 1115 02/10/21 1130  BP: (!) 162/94 (!) 152/89  Pulse: (!) 49 (!) 50  Resp: 10 12  Temp:    SpO2: 98% 99%    Last Pain:  Vitals:   02/10/21 1130  TempSrc:   PainSc: Asleep                 Lynda Rainwater

## 2021-02-11 ENCOUNTER — Encounter (HOSPITAL_COMMUNITY): Payer: Self-pay | Admitting: Urology

## 2021-02-11 DIAGNOSIS — I2510 Atherosclerotic heart disease of native coronary artery without angina pectoris: Secondary | ICD-10-CM | POA: Diagnosis not present

## 2021-02-11 DIAGNOSIS — C61 Malignant neoplasm of prostate: Secondary | ICD-10-CM | POA: Diagnosis not present

## 2021-02-11 DIAGNOSIS — Z7982 Long term (current) use of aspirin: Secondary | ICD-10-CM | POA: Diagnosis not present

## 2021-02-11 DIAGNOSIS — Z955 Presence of coronary angioplasty implant and graft: Secondary | ICD-10-CM | POA: Diagnosis not present

## 2021-02-11 DIAGNOSIS — Z79899 Other long term (current) drug therapy: Secondary | ICD-10-CM | POA: Diagnosis not present

## 2021-02-11 LAB — HEMOGLOBIN AND HEMATOCRIT, BLOOD
HCT: 32.7 % — ABNORMAL LOW (ref 39.0–52.0)
HCT: 33.4 % — ABNORMAL LOW (ref 39.0–52.0)
Hemoglobin: 10.8 g/dL — ABNORMAL LOW (ref 13.0–17.0)
Hemoglobin: 10.8 g/dL — ABNORMAL LOW (ref 13.0–17.0)

## 2021-02-11 MED ORDER — TRAMADOL HCL 50 MG PO TABS: 50.0000 mg | ORAL_TABLET | Freq: Four times a day (QID) | ORAL | Status: DC | PRN

## 2021-02-11 MED ORDER — BISACODYL 10 MG RE SUPP
10.0000 mg | Freq: Once | RECTAL | Status: AC
  Administered 2021-02-11: 10 mg via RECTAL
  Filled 2021-02-11: qty 1

## 2021-02-11 MED ORDER — ASPIRIN EC 81 MG PO TBEC: 81.0000 mg | DELAYED_RELEASE_TABLET | Freq: Every day | ORAL | Status: DC

## 2021-02-11 MED ORDER — ASPIRIN EC 81 MG PO TBEC
81.0000 mg | DELAYED_RELEASE_TABLET | Freq: Every day | ORAL | Status: DC
  Administered 2021-02-11: 81 mg via ORAL
  Filled 2021-02-11: qty 1

## 2021-02-11 NOTE — Progress Notes (Addendum)
1 Day Post-Op Subjective: The patient is doing well.  Tolerating CLD without nausea or vomiting. Pain is adequately controlled. Has ambulated.   Objective: Vital signs in last 24 hours: Temp:  [95.8 F (35.4 C)-99.3 F (37.4 C)] 98.4 F (36.9 C) (03/15 0534) Pulse Rate:  [49-59] 55 (03/15 0534) Resp:  [10-20] 17 (03/15 0534) BP: (112-164)/(82-96) 112/82 (03/15 0534) SpO2:  [96 %-100 %] 98 % (03/15 0534) Weight:  [54.2 kg] 54.2 kg (03/14 1152)  Intake/Output from previous day: 03/14 0701 - 03/15 0700 In: 2825 [P.O.:240; I.V.:2422.6; IV Piggyback:162.4] Out: 2678 [Urine:2350; Drains:223; Blood:50] Intake/Output this shift: No intake/output data recorded.  Physical Exam:  General: Alert and oriented CV: Regular rate Lungs: NWOB on RA GI: Soft, appropriately tender, nondistended. Incisions intact and covered with dermabond; no signs of infection. LLQ JP drain with minimal SS output  Urine: Foley in place draining watermelon-colored urine  Extremities: Warm and well-perfused   Lab Results: Recent Labs    02/10/21 1034 02/11/21 0420  HGB 13.1 10.8*  HCT 40.7 32.7*      Assessment/Plan: POD# 1 s/p robotic prostatectomy.  1) SL IVF 2) Ambulate, Incentive spirometry 3) Transition to oral pain medication 4) Dulcolax suppository 5) D/C pelvic drain 6) Plan for likely discharge later today 7) Continue ASA 81mg  peri-operatively    LOS: 0 days   Celene Squibb 02/11/2021, 7:30 AM

## 2021-02-11 NOTE — Progress Notes (Signed)
Foley irrigated with 300cc sterile water per order. All fluid returned with minimal red sediment. Pt tolerated procedure well without complications. Foley leg bag teaching completed with pt and wife. Teach back method used and return demonstration. Discharge paperwork reviewed with pt and wife. Pt and wife reported understanding. PIV x2 removed. All belongings still at bedside.

## 2021-02-11 NOTE — Discharge Summary (Signed)
  Date of admission: 02/10/2021  Date of discharge: 02/11/2021  Admission diagnosis: Prostate Cancer  Discharge diagnosis: Prostate Cancer  History and Physical: For full details, please see admission history and physical. Briefly, Patrick Barnes is a 70 y.o. gentleman with localized prostate cancer.  After discussing management/treatment options, he elected to proceed with surgical treatment.  Hospital Course: Patrick Barnes was taken to the operating room on 02/10/2021 and underwent a robotic assisted laparoscopic radical prostatectomy. He tolerated this procedure well and without complications. Postoperatively, he was able to be transferred to a regular hospital room following recovery from anesthesia.  He was able to begin ambulating the night of surgery. He remained hemodynamically stable overnight.  He had excellent urine output with appropriately minimal output from his pelvic drain and his pelvic drain was removed on POD #1.  He was transitioned to oral pain medication, tolerated a clear liquid diet, and had met all discharge criteria and was able to be discharged home later on POD#1.  Laboratory values: Recent Labs    02/10/21 1034 02/11/21 0420 02/11/21 1056  HGB 13.1 10.8* 10.8*  HCT 40.7 32.7* 33.4*    Disposition: Home  Discharge instruction: He was instructed to be ambulatory but to refrain from heavy lifting, strenuous activity, or driving. He was instructed on urethral catheter care.  Discharge medications:   Allergies as of 02/11/2021      Reactions   Quinolones    Prolonged QT      Medication List    STOP taking these medications   cholecalciferol 25 MCG (1000 UNIT) tablet Commonly known as: VITAMIN D3   Melatonin 3 MG Caps   MULTIPLE VITAMIN PO   silodosin 4 MG Caps capsule Commonly known as: RAPAFLO   solifenacin 5 MG tablet Commonly known as: VESICARE     TAKE these medications   acetaminophen 500 MG tablet Commonly known as: TYLENOL Take 1,000 mg by  mouth daily as needed. For sinus headaches   aspirin EC 81 MG tablet Take 81 mg by mouth daily. Swallow whole.   escitalopram 10 MG tablet Commonly known as: LEXAPRO Take 10 mg by mouth daily.   Fortesta 10 MG/ACT (2%) Gel Generic drug: Testosterone Place 4 Squirts onto the skin daily.   nitroGLYCERIN 0.4 MG SL tablet Commonly known as: NITROSTAT PLACE 1 TABLET UNDER TONGUE EVERY 5 MINUTES AS NEEDED FOR CHEST PAIN.[name] Kitchen MAXIMUM OF 3 DOSES   ramipril 2.5 MG capsule Commonly known as: ALTACE Take 2.5 mg by mouth daily.   simvastatin 40 MG tablet Commonly known as: ZOCOR Take 40 mg by mouth daily.   sulfamethoxazole-trimethoprim 800-160 MG tablet Commonly known as: BACTRIM DS Take 1 tablet by mouth 2 (two) times daily. Start the day prior to foley removal appointment   traMADol 50 MG tablet Commonly known as: Ultram Take 1-2 tablets (50-100 mg total) by mouth every 6 (six) hours as needed for moderate pain or severe pain.       Followup: He will followup in 1 week for catheter removal and to discuss his surgical pathology results.

## 2021-02-14 LAB — SURGICAL PATHOLOGY

## 2021-02-17 ENCOUNTER — Telehealth: Payer: Self-pay | Admitting: Cardiology

## 2021-02-17 NOTE — Telephone Encounter (Signed)
Spoke with the patient's wife who states that the patient has had swelling in his feet and ankles. States that he has gained about 3-4 lbs in the past week. He recently had prostatectomy and swelling has been getting worse since then. She states that he had swelling in his hands after surgery too but that improved. She states that he is not having shortness of breath and no change in diet. She repots his BP has been good 127/73 and 110/75. She states he normally has a low resting heart rate in the low 40s but it was up in the 60s yesterday and this morning was 83. He is elevating his feet some at night.

## 2021-02-17 NOTE — Telephone Encounter (Signed)
Pt c/o swelling: STAT is pt has developed SOB within 24 hours  1) How much weight have you gained and in what time span? no  2) If swelling, where is the swelling located? Ankles and feet  3) Are you currently taking a fluid pill? no*  4) Are you currently SOB? no*  5) Do you have a log of your daily weights (if so, list)? no  6) Have you gained 3 pounds in a day or 5 pounds in a week?  Do not think so  7) Have you traveled recently? no

## 2021-02-18 NOTE — Telephone Encounter (Signed)
Spoke with the patient's wife and advised her that Dr. Radford Pax wanted the patient to see his PCP. She states that he is following up with the surgeon on Friday but will call PCP as well.

## 2021-02-18 NOTE — Telephone Encounter (Signed)
Please get him in to see his PCP

## 2021-02-24 DIAGNOSIS — N393 Stress incontinence (female) (male): Secondary | ICD-10-CM | POA: Diagnosis not present

## 2021-02-24 DIAGNOSIS — R350 Frequency of micturition: Secondary | ICD-10-CM | POA: Diagnosis not present

## 2021-02-24 DIAGNOSIS — M6281 Muscle weakness (generalized): Secondary | ICD-10-CM | POA: Diagnosis not present

## 2021-03-04 DIAGNOSIS — F39 Unspecified mood [affective] disorder: Secondary | ICD-10-CM | POA: Diagnosis not present

## 2021-03-06 DIAGNOSIS — F411 Generalized anxiety disorder: Secondary | ICD-10-CM | POA: Diagnosis not present

## 2021-03-11 DIAGNOSIS — M6281 Muscle weakness (generalized): Secondary | ICD-10-CM | POA: Diagnosis not present

## 2021-03-11 DIAGNOSIS — N393 Stress incontinence (female) (male): Secondary | ICD-10-CM | POA: Diagnosis not present

## 2021-03-11 DIAGNOSIS — R350 Frequency of micturition: Secondary | ICD-10-CM | POA: Diagnosis not present

## 2021-03-16 DIAGNOSIS — C61 Malignant neoplasm of prostate: Secondary | ICD-10-CM | POA: Diagnosis not present

## 2021-03-17 ENCOUNTER — Other Ambulatory Visit: Payer: Self-pay | Admitting: Internal Medicine

## 2021-03-17 ENCOUNTER — Ambulatory Visit
Admission: RE | Admit: 2021-03-17 | Discharge: 2021-03-17 | Disposition: A | Discharge status: Home / Self Care | Payer: Medicare Other | Source: Ambulatory Visit | Attending: Internal Medicine | Admitting: Internal Medicine

## 2021-03-17 ENCOUNTER — Other Ambulatory Visit: Payer: Self-pay

## 2021-03-17 DIAGNOSIS — E230 Hypopituitarism: Secondary | ICD-10-CM | POA: Diagnosis not present

## 2021-03-17 DIAGNOSIS — E222 Syndrome of inappropriate secretion of antidiuretic hormone: Secondary | ICD-10-CM

## 2021-03-25 DIAGNOSIS — N393 Stress incontinence (female) (male): Secondary | ICD-10-CM | POA: Diagnosis not present

## 2021-03-25 DIAGNOSIS — M6281 Muscle weakness (generalized): Secondary | ICD-10-CM | POA: Diagnosis not present

## 2021-03-25 DIAGNOSIS — R350 Frequency of micturition: Secondary | ICD-10-CM | POA: Diagnosis not present

## 2021-03-26 DIAGNOSIS — E222 Syndrome of inappropriate secretion of antidiuretic hormone: Secondary | ICD-10-CM | POA: Diagnosis not present

## 2021-03-27 DIAGNOSIS — L819 Disorder of pigmentation, unspecified: Secondary | ICD-10-CM | POA: Diagnosis not present

## 2021-03-27 DIAGNOSIS — L821 Other seborrheic keratosis: Secondary | ICD-10-CM | POA: Diagnosis not present

## 2021-03-27 DIAGNOSIS — F411 Generalized anxiety disorder: Secondary | ICD-10-CM | POA: Diagnosis not present

## 2021-03-27 DIAGNOSIS — D1801 Hemangioma of skin and subcutaneous tissue: Secondary | ICD-10-CM | POA: Diagnosis not present

## 2021-03-27 DIAGNOSIS — D229 Melanocytic nevi, unspecified: Secondary | ICD-10-CM | POA: Diagnosis not present

## 2021-03-27 DIAGNOSIS — L814 Other melanin hyperpigmentation: Secondary | ICD-10-CM | POA: Diagnosis not present

## 2021-04-04 DIAGNOSIS — E871 Hypo-osmolality and hyponatremia: Secondary | ICD-10-CM | POA: Diagnosis not present

## 2021-04-22 DIAGNOSIS — M6289 Other specified disorders of muscle: Secondary | ICD-10-CM | POA: Diagnosis not present

## 2021-04-22 DIAGNOSIS — M62838 Other muscle spasm: Secondary | ICD-10-CM | POA: Diagnosis not present

## 2021-04-22 DIAGNOSIS — N393 Stress incontinence (female) (male): Secondary | ICD-10-CM | POA: Diagnosis not present

## 2021-04-22 DIAGNOSIS — M6281 Muscle weakness (generalized): Secondary | ICD-10-CM | POA: Diagnosis not present

## 2021-04-24 DIAGNOSIS — F411 Generalized anxiety disorder: Secondary | ICD-10-CM | POA: Diagnosis not present

## 2021-04-25 DIAGNOSIS — N400 Enlarged prostate without lower urinary tract symptoms: Secondary | ICD-10-CM | POA: Diagnosis not present

## 2021-04-25 DIAGNOSIS — M8580 Other specified disorders of bone density and structure, unspecified site: Secondary | ICD-10-CM | POA: Diagnosis not present

## 2021-04-25 DIAGNOSIS — F3341 Major depressive disorder, recurrent, in partial remission: Secondary | ICD-10-CM | POA: Diagnosis not present

## 2021-04-25 DIAGNOSIS — E7800 Pure hypercholesterolemia, unspecified: Secondary | ICD-10-CM | POA: Diagnosis not present

## 2021-04-25 DIAGNOSIS — I2510 Atherosclerotic heart disease of native coronary artery without angina pectoris: Secondary | ICD-10-CM | POA: Diagnosis not present

## 2021-04-25 IMAGING — CR DG CHEST 2V
2 series · 2 of 2 positions shown · non-contrast
Comparison: Radiograph June 28, 2020.

CLINICAL DATA: SIADH history of prostate cancer assess for
pulmonary neoplasm.

EXAM:
CHEST - 2 VIEW

[w chest pa]
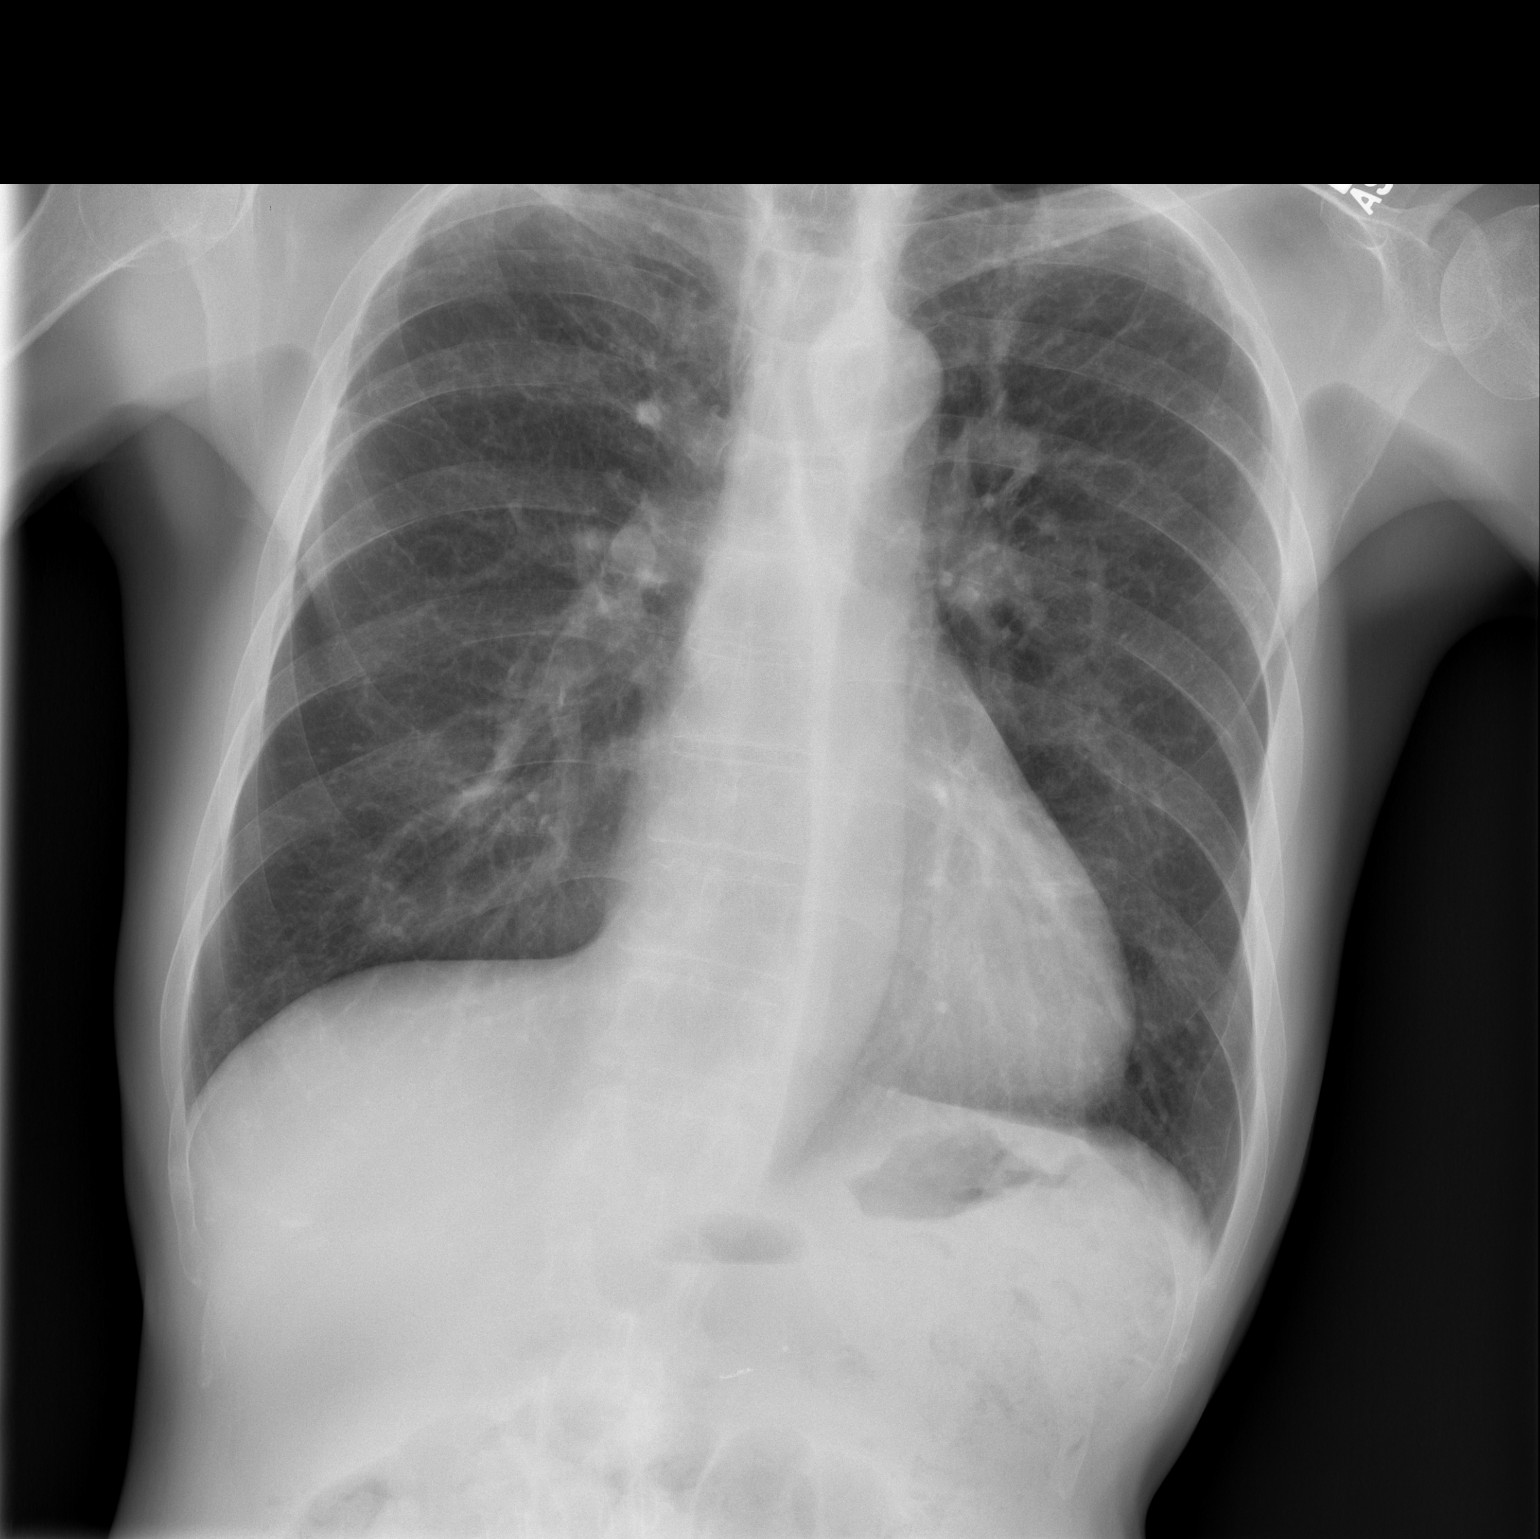

[w chest lat]
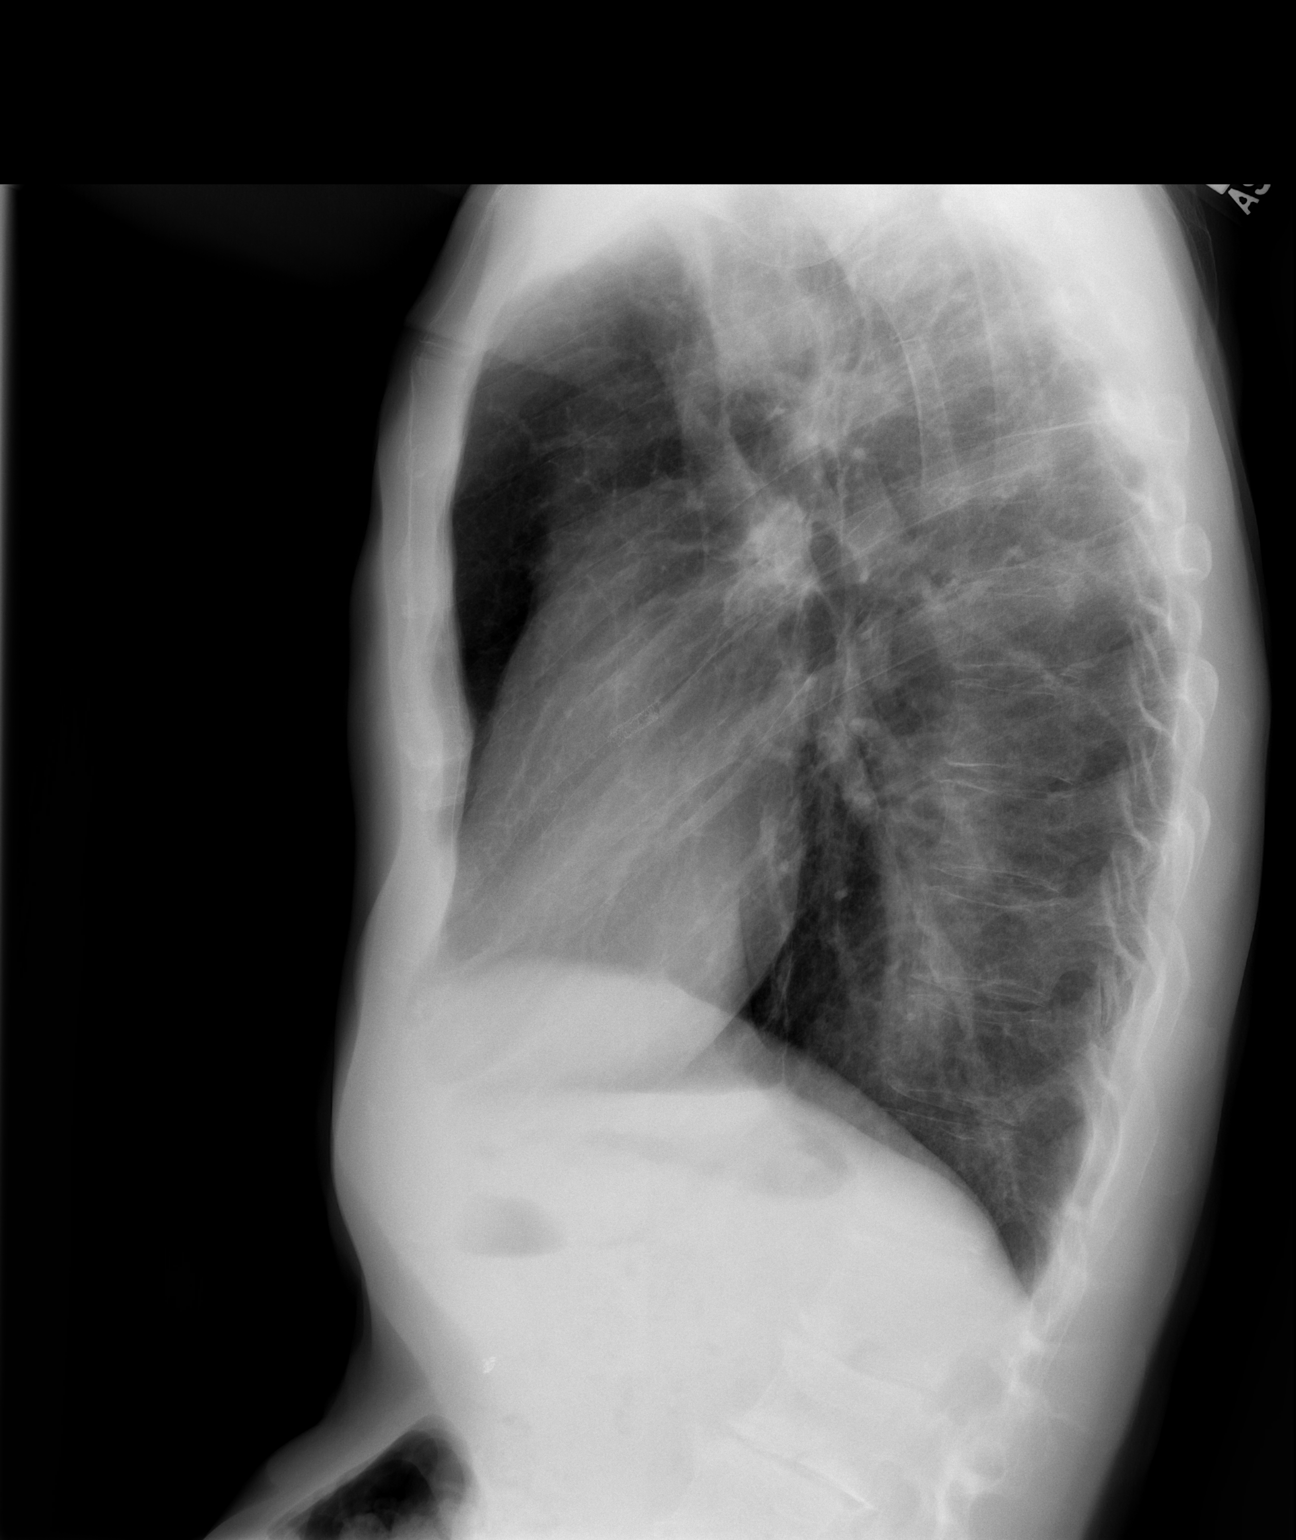

[2 of 2 positions shown; findings below may reference images not displayed]

FINDINGS: The heart size and mediastinal contours are within normal limits. No
focal consolidation. No discrete mass like lesions. Biapical
scarring, similar prior. Thoracic spondylosis.
IMPRESSION: No acute process in the chest. Please note if continued clinical
concern for pulmonary neoplasm, CT is more sensitive.

## 2021-05-05 DIAGNOSIS — R7303 Prediabetes: Secondary | ICD-10-CM | POA: Diagnosis not present

## 2021-05-05 DIAGNOSIS — R636 Underweight: Secondary | ICD-10-CM | POA: Diagnosis not present

## 2021-05-05 DIAGNOSIS — I2510 Atherosclerotic heart disease of native coronary artery without angina pectoris: Secondary | ICD-10-CM | POA: Diagnosis not present

## 2021-05-05 DIAGNOSIS — E222 Syndrome of inappropriate secretion of antidiuretic hormone: Secondary | ICD-10-CM | POA: Diagnosis not present

## 2021-05-05 DIAGNOSIS — Z8546 Personal history of malignant neoplasm of prostate: Secondary | ICD-10-CM | POA: Diagnosis not present

## 2021-05-05 DIAGNOSIS — E7800 Pure hypercholesterolemia, unspecified: Secondary | ICD-10-CM | POA: Diagnosis not present

## 2021-05-05 DIAGNOSIS — F3341 Major depressive disorder, recurrent, in partial remission: Secondary | ICD-10-CM | POA: Diagnosis not present

## 2021-05-26 DIAGNOSIS — R519 Headache, unspecified: Secondary | ICD-10-CM | POA: Diagnosis not present

## 2021-05-26 DIAGNOSIS — R059 Cough, unspecified: Secondary | ICD-10-CM | POA: Diagnosis not present

## 2021-05-26 DIAGNOSIS — R0981 Nasal congestion: Secondary | ICD-10-CM | POA: Diagnosis not present

## 2021-05-26 DIAGNOSIS — U071 COVID-19: Secondary | ICD-10-CM | POA: Diagnosis not present

## 2021-06-10 DIAGNOSIS — C61 Malignant neoplasm of prostate: Secondary | ICD-10-CM | POA: Diagnosis not present

## 2021-06-13 DIAGNOSIS — C61 Malignant neoplasm of prostate: Secondary | ICD-10-CM | POA: Diagnosis not present

## 2021-06-13 DIAGNOSIS — N393 Stress incontinence (female) (male): Secondary | ICD-10-CM | POA: Diagnosis not present

## 2021-06-17 DIAGNOSIS — M62838 Other muscle spasm: Secondary | ICD-10-CM | POA: Diagnosis not present

## 2021-06-17 DIAGNOSIS — M6281 Muscle weakness (generalized): Secondary | ICD-10-CM | POA: Diagnosis not present

## 2021-06-17 DIAGNOSIS — N393 Stress incontinence (female) (male): Secondary | ICD-10-CM | POA: Diagnosis not present

## 2021-06-17 DIAGNOSIS — R350 Frequency of micturition: Secondary | ICD-10-CM | POA: Diagnosis not present

## 2021-06-17 DIAGNOSIS — M6289 Other specified disorders of muscle: Secondary | ICD-10-CM | POA: Diagnosis not present

## 2021-06-27 DIAGNOSIS — E871 Hypo-osmolality and hyponatremia: Secondary | ICD-10-CM | POA: Diagnosis not present

## 2021-07-02 DIAGNOSIS — E222 Syndrome of inappropriate secretion of antidiuretic hormone: Secondary | ICD-10-CM | POA: Diagnosis not present

## 2021-07-02 DIAGNOSIS — Z833 Family history of diabetes mellitus: Secondary | ICD-10-CM | POA: Diagnosis not present

## 2021-07-02 DIAGNOSIS — E230 Hypopituitarism: Secondary | ICD-10-CM | POA: Diagnosis not present

## 2021-07-14 DIAGNOSIS — I2510 Atherosclerotic heart disease of native coronary artery without angina pectoris: Secondary | ICD-10-CM | POA: Diagnosis not present

## 2021-07-14 DIAGNOSIS — N400 Enlarged prostate without lower urinary tract symptoms: Secondary | ICD-10-CM | POA: Diagnosis not present

## 2021-07-14 DIAGNOSIS — F339 Major depressive disorder, recurrent, unspecified: Secondary | ICD-10-CM | POA: Diagnosis not present

## 2021-07-14 DIAGNOSIS — N402 Nodular prostate without lower urinary tract symptoms: Secondary | ICD-10-CM | POA: Diagnosis not present

## 2021-07-14 DIAGNOSIS — F3341 Major depressive disorder, recurrent, in partial remission: Secondary | ICD-10-CM | POA: Diagnosis not present

## 2021-07-14 DIAGNOSIS — E7800 Pure hypercholesterolemia, unspecified: Secondary | ICD-10-CM | POA: Diagnosis not present

## 2021-07-14 DIAGNOSIS — Z8546 Personal history of malignant neoplasm of prostate: Secondary | ICD-10-CM | POA: Diagnosis not present

## 2021-07-14 DIAGNOSIS — M8580 Other specified disorders of bone density and structure, unspecified site: Secondary | ICD-10-CM | POA: Diagnosis not present

## 2021-07-17 DIAGNOSIS — F411 Generalized anxiety disorder: Secondary | ICD-10-CM | POA: Diagnosis not present

## 2021-07-31 DIAGNOSIS — F39 Unspecified mood [affective] disorder: Secondary | ICD-10-CM | POA: Diagnosis not present

## 2021-08-22 DIAGNOSIS — F411 Generalized anxiety disorder: Secondary | ICD-10-CM | POA: Diagnosis not present

## 2021-08-30 DIAGNOSIS — Z23 Encounter for immunization: Secondary | ICD-10-CM | POA: Diagnosis not present

## 2021-09-11 DIAGNOSIS — F411 Generalized anxiety disorder: Secondary | ICD-10-CM | POA: Diagnosis not present

## 2021-09-29 ENCOUNTER — Other Ambulatory Visit: Payer: Self-pay | Admitting: Urology

## 2021-10-16 DIAGNOSIS — Z8546 Personal history of malignant neoplasm of prostate: Secondary | ICD-10-CM | POA: Diagnosis not present

## 2021-10-16 DIAGNOSIS — E7800 Pure hypercholesterolemia, unspecified: Secondary | ICD-10-CM | POA: Diagnosis not present

## 2021-10-16 DIAGNOSIS — M8580 Other specified disorders of bone density and structure, unspecified site: Secondary | ICD-10-CM | POA: Diagnosis not present

## 2021-10-16 DIAGNOSIS — N400 Enlarged prostate without lower urinary tract symptoms: Secondary | ICD-10-CM | POA: Diagnosis not present

## 2021-10-16 DIAGNOSIS — F339 Major depressive disorder, recurrent, unspecified: Secondary | ICD-10-CM | POA: Diagnosis not present

## 2021-10-16 DIAGNOSIS — F411 Generalized anxiety disorder: Secondary | ICD-10-CM | POA: Diagnosis not present

## 2021-10-16 DIAGNOSIS — I2510 Atherosclerotic heart disease of native coronary artery without angina pectoris: Secondary | ICD-10-CM | POA: Diagnosis not present

## 2021-10-16 DIAGNOSIS — F3341 Major depressive disorder, recurrent, in partial remission: Secondary | ICD-10-CM | POA: Diagnosis not present

## 2021-10-16 DIAGNOSIS — N402 Nodular prostate without lower urinary tract symptoms: Secondary | ICD-10-CM | POA: Diagnosis not present

## 2021-11-13 DIAGNOSIS — F411 Generalized anxiety disorder: Secondary | ICD-10-CM | POA: Diagnosis not present

## 2021-12-15 DIAGNOSIS — H25813 Combined forms of age-related cataract, bilateral: Secondary | ICD-10-CM | POA: Diagnosis not present

## 2021-12-15 DIAGNOSIS — H5213 Myopia, bilateral: Secondary | ICD-10-CM | POA: Diagnosis not present

## 2021-12-16 DIAGNOSIS — E222 Syndrome of inappropriate secretion of antidiuretic hormone: Secondary | ICD-10-CM | POA: Diagnosis not present

## 2021-12-16 DIAGNOSIS — E7800 Pure hypercholesterolemia, unspecified: Secondary | ICD-10-CM | POA: Diagnosis not present

## 2021-12-16 DIAGNOSIS — F411 Generalized anxiety disorder: Secondary | ICD-10-CM | POA: Diagnosis not present

## 2021-12-16 DIAGNOSIS — Z8546 Personal history of malignant neoplasm of prostate: Secondary | ICD-10-CM | POA: Diagnosis not present

## 2021-12-16 DIAGNOSIS — F3341 Major depressive disorder, recurrent, in partial remission: Secondary | ICD-10-CM | POA: Diagnosis not present

## 2021-12-16 DIAGNOSIS — I2510 Atherosclerotic heart disease of native coronary artery without angina pectoris: Secondary | ICD-10-CM | POA: Diagnosis not present

## 2021-12-16 DIAGNOSIS — R7303 Prediabetes: Secondary | ICD-10-CM | POA: Diagnosis not present

## 2021-12-18 DIAGNOSIS — F411 Generalized anxiety disorder: Secondary | ICD-10-CM | POA: Diagnosis not present

## 2022-01-07 DIAGNOSIS — L814 Other melanin hyperpigmentation: Secondary | ICD-10-CM | POA: Diagnosis not present

## 2022-01-07 DIAGNOSIS — L821 Other seborrheic keratosis: Secondary | ICD-10-CM | POA: Diagnosis not present

## 2022-01-07 DIAGNOSIS — B353 Tinea pedis: Secondary | ICD-10-CM | POA: Diagnosis not present

## 2022-01-07 DIAGNOSIS — D225 Melanocytic nevi of trunk: Secondary | ICD-10-CM | POA: Diagnosis not present

## 2022-01-07 DIAGNOSIS — L249 Irritant contact dermatitis, unspecified cause: Secondary | ICD-10-CM | POA: Diagnosis not present

## 2022-01-15 DIAGNOSIS — F411 Generalized anxiety disorder: Secondary | ICD-10-CM | POA: Diagnosis not present

## 2022-01-16 DIAGNOSIS — C61 Malignant neoplasm of prostate: Secondary | ICD-10-CM | POA: Diagnosis not present

## 2022-01-21 DIAGNOSIS — U071 COVID-19: Secondary | ICD-10-CM | POA: Diagnosis not present

## 2022-01-23 DIAGNOSIS — C61 Malignant neoplasm of prostate: Secondary | ICD-10-CM | POA: Diagnosis not present

## 2022-01-23 DIAGNOSIS — N393 Stress incontinence (female) (male): Secondary | ICD-10-CM | POA: Diagnosis not present

## 2022-02-26 DIAGNOSIS — F411 Generalized anxiety disorder: Secondary | ICD-10-CM | POA: Diagnosis not present

## 2022-03-17 DIAGNOSIS — L814 Other melanin hyperpigmentation: Secondary | ICD-10-CM | POA: Diagnosis not present

## 2022-03-17 DIAGNOSIS — D1801 Hemangioma of skin and subcutaneous tissue: Secondary | ICD-10-CM | POA: Diagnosis not present

## 2022-03-26 DIAGNOSIS — F411 Generalized anxiety disorder: Secondary | ICD-10-CM | POA: Diagnosis not present

## 2022-04-23 ENCOUNTER — Ambulatory Visit (INDEPENDENT_AMBULATORY_CARE_PROVIDER_SITE_OTHER): Payer: Medicare Other | Admitting: Cardiology

## 2022-04-23 ENCOUNTER — Encounter: Payer: Self-pay | Admitting: Cardiology

## 2022-04-23 VITALS — BP 122/80 | HR 46 | Ht 65.0 in | Wt 123.4 lb

## 2022-04-23 DIAGNOSIS — I493 Ventricular premature depolarization: Secondary | ICD-10-CM | POA: Diagnosis not present

## 2022-04-23 DIAGNOSIS — I4510 Unspecified right bundle-branch block: Secondary | ICD-10-CM

## 2022-04-23 DIAGNOSIS — R55 Syncope and collapse: Secondary | ICD-10-CM

## 2022-04-23 DIAGNOSIS — F411 Generalized anxiety disorder: Secondary | ICD-10-CM | POA: Diagnosis not present

## 2022-04-23 DIAGNOSIS — I420 Dilated cardiomyopathy: Secondary | ICD-10-CM | POA: Diagnosis not present

## 2022-04-23 DIAGNOSIS — I2510 Atherosclerotic heart disease of native coronary artery without angina pectoris: Secondary | ICD-10-CM | POA: Diagnosis not present

## 2022-04-23 DIAGNOSIS — E7800 Pure hypercholesterolemia, unspecified: Secondary | ICD-10-CM

## 2022-04-23 NOTE — Progress Notes (Signed)
Cardiology Office Note:    Date:  04/23/2022   ID:  Patrick Barnes, DOB November 10, 1951, MRN [id number]  PCP:  Leighton Ruff, MD (Inactive)  Cardiologist:  Fransico Him, MD    Referring MD: No ref. provider found   Chief Complaint  Patient presents with   Coronary Artery Disease   Hyperlipidemia   Cardiomyopathy    History of Present Illness:    Patrick Barnes is a 71 y.o. male with a hx of ASCAD s/p PCI of LAD 2013, RVOT PVCs and VT s/p ablation, ischemic DCM EF 45-50%, micturition syncope and dyslipidemia. He is here today for followup and is doing well.  He denies any chest pain or pressure, SOB, DOE, PND, orthopnea, LE edema, dizziness, palpitations or syncope. He is compliant with his meds and is tolerating meds with no SE.     Past Medical History:  Diagnosis Date   Anxiety    Arthritis    hands   Bradycardia 07/23/2016   CAD (coronary artery disease) 01/2012   cardiac cath...with 80% stenosis in proximal LAD with PCI      Cancer [facility])    prostate   Dilated cardiomyopathy ([place])    Echo 11/2012 EF 45-50%   Dyslipidemia    Elevated CPK    Proboly due to excessive exercise.   Elevated LFTs    history of-Now resolved   H/O vasectomy    Hx of tonsillectomy    Hypogonadotropic hypogonadism ([facility])    Dr. Buddy Duty and Dr Jeffie Pollock   Inguinal hernia    Low bone density for age    endo is following   Low testosterone    PVC's (premature ventricular contractions)    s/p ablation Dr Rayann Heman and Dr Radford Pax   RBBB    Vitamin D deficiency     Past Surgical History:  Procedure Laterality Date   CARDIAC CATHETERIZATION     w 80% stenosis in proimal LAD w PCI on 2/13.   COLONOSCOPY  10/2005   had internal nonbleeding small hemorrhoids,repeat in 10 years.   CYSTECTOMY     EP study and ablation for RVOT PVCs  08/12/12   HERNIA REPAIR     Inguinal   LYMPHADENECTOMY Bilateral 02/10/2021   Procedure: LYMPHADENECTOMY, PELVIC;  Surgeon: Raynelle Bring, MD;  Location: [facility] ORS;  Service:  Urology;  Laterality: Bilateral;   PERCUTANEOUS CORONARY STENT INTERVENTION (PCI-S) Bilateral 01/13/2012   Procedure: PERCUTANEOUS CORONARY STENT INTERVENTION (PCI-S);  Surgeon: Jettie Booze, MD;  Location: [facility] CATH LAB;  Service: Cardiovascular;  Laterality: Bilateral;  possible radial artery   ROBOT ASSISTED LAPAROSCOPIC RADICAL PROSTATECTOMY N/A 02/10/2021   Procedure: XI ROBOTIC ASSISTED LAPAROSCOPIC RADICAL PROSTATECTOMY LEVEL 2;  Surgeon: Raynelle Bring, MD;  Location: [facility] ORS;  Service: Urology;  Laterality: N/A;   TONSILLECTOMY     V-TACH ABLATION N/A 08/11/2012   Procedure: V-TACH ABLATION;  Surgeon: Thompson Grayer, MD;  Location: [facility] CATH LAB;  Service: Cardiovascular;  Laterality: N/A;   VASECTOMY      Current Medications: Current Meds  Medication Sig   acetaminophen (TYLENOL) 500 MG tablet Take 1,000 mg by mouth daily as needed. For sinus headaches   escitalopram (LEXAPRO) 10 MG tablet Take 10 mg by mouth daily.   nitroGLYCERIN (NITROSTAT) 0.4 MG SL tablet PLACE 1 TABLET UNDER TONGUE EVERY 5 MINUTES AS NEEDED FOR CHEST PAIN.[name] Kitchen MAXIMUM OF 3 DOSES   ramipril (ALTACE) 2.5 MG capsule Take 2.5 mg by mouth daily.    simvastatin (ZOCOR) 40 MG tablet Take 40  mg by mouth daily.   sulfamethoxazole-trimethoprim (BACTRIM DS) 800-160 MG tablet Take 1 tablet by mouth 2 (two) times daily. Start the day prior to foley removal appointment   traMADol (ULTRAM) 50 MG tablet Take 1-2 tablets (50-100 mg total) by mouth every 6 (six) hours as needed for moderate pain or severe pain.     Allergies:   Quinolones   Social History   Socioeconomic History   Marital status: Married    Spouse name: Not on file   Number of children: Not on file   Years of education: Not on file   Highest education level: Not on file  Occupational History   Occupation: retired  Tobacco Use   Smoking status: Never   Smokeless tobacco: Never  Vaping Use   Vaping Use: Never used  Substance and Sexual Activity    Alcohol use: Yes    Comment: occasional   Drug use: No   Sexual activity: Not Currently  Other Topics Concern   Not on file  Social History Narrative   Not on file   Social Determinants of Health   Financial Resource Strain: Not on file  Food Insecurity: Not on file  Transportation Needs: Not on file  Physical Activity: Not on file  Stress: Not on file  Social Connections: Not on file     Family History: The patient's family history includes Cancer in his mother.  ROS:   Please see the history of present illness.    ROS  All other systems reviewed and negative.   EKGs/Labs/Other Studies Reviewed:    The following studies were reviewed today: none  EKG:  EKG is  ordered today.  The ekg ordered today demonstrates sinus bradycardia at 46bpm Recent Labs: No results found for requested labs within last 8760 hours.   Recent Lipid Panel    Component Value Date/Time   CHOL 137 10/16/2014 0740   TRIG 46.0 10/16/2014 0740   HDL 60.90 10/16/2014 0740   CHOLHDL 2 10/16/2014 0740   VLDL 9.2 10/16/2014 0740   LDLCALC 67 10/16/2014 0740    Physical Exam:    VS:  BP 122/80   Pulse (!) 46   Ht '5\' 5"'$  (1.651 m)   Wt 123 lb 6.4 oz (56 kg)   SpO2 98%   BMI 20.53 kg/m     Wt Readings from Last 3 Encounters:  04/23/22 123 lb 6.4 oz (56 kg)  02/10/21 119 lb 7.8 oz (54.2 kg)  02/04/21 125 lb (56.7 kg)    GEN: Well nourished, well developed in no acute distress HEENT: Normal NECK: No JVD; No carotid bruits LYMPHATICS: No lymphadenopathy CARDIAC:RRR, no murmurs, rubs, gallops RESPIRATORY:  Clear to auscultation without rales, wheezing or rhonchi  ABDOMEN: Soft, non-tender, non-distended MUSCULOSKELETAL:  No edema; No deformity  SKIN: Warm and dry NEUROLOGIC:  Alert and oriented x 3 PSYCHIATRIC:  Normal affect   ASSESSMENT:    1. Coronary artery disease involving native coronary artery of native heart without angina pectoris   2. Dilated cardiomyopathy ([place])   3. PVC  (premature ventricular contraction)   4. Pure hypercholesterolemia   5. RBBB   6. Syncope and collapse    PLAN:    In order of problems listed above:  1.  ASCAD  -s/p PCI of LAD 2013.   -He denies any anginal symptoms since I saw him last -Continue prescription drug management with aspirin 81 mg daily and simvastatin with PRN Refills  2.  DCM  -EF 45-50% by  echo 2019 with diffuse HK and G2DD -He does not appear volume overloaded on exam today -Repeat 2D echo to follow-up on EF  3.  PVCs and VT  -s/p ablation  -He denies any palpitations  4.  Hyperlipidemia  -LDL goal < 70.   -I have personally reviewed and interpreted outside labs performed by patient's PCP which showed LDL 67, HDL 58, triglycerides 80 on 12/16/2021 and normal ALT at 22 -Continue prescription drug management with simvastatin 40 mg daily with as needed refills  5.  RBBB  - he is asymptomatic and is very active riding his elliptical 70mn daily.   -stress myoview 2019 with no ischemia  6.  Syncope -felt to be micturition induced -he was bradycardic in ER in the 30's but normally runs 40-50's -He has not had any further dizziness or syncope -30-day event monitor showed Normal sinus rhythm and a rare PAC with average heart rate 63 bpm  Medication Adjustments/Labs and Tests Ordered: Current medicines are reviewed at length with the patient today.  Concerns regarding medicines are outlined above.  Orders Placed This Encounter  Procedures   EKG 12-Lead   No orders of the defined types were placed in this encounter.   Signed, TFransico Him MD  04/23/2022 10:54 AM    [place]

## 2022-04-23 NOTE — Addendum Note (Signed)
Addended by: Antonieta Iba on: 04/23/2022 11:11 AM   Modules accepted: Orders

## 2022-04-23 NOTE — Patient Instructions (Signed)
Medication Instructions:  Your physician recommends that you continue on your current medications as directed. Please refer to the Current Medication list given to you today.  *If you need a refill on your cardiac medications before your next appointment, please call your pharmacy*  Testing/Procedures: Your physician has requested that you have an echocardiogram. Echocardiography is a painless test that uses sound waves to create images of your heart. It provides your doctor with information about the size and shape of your heart and how well your heart's chambers and valves are working. This procedure takes approximately one hour. There are no restrictions for this procedure.  Follow-Up: At [facility], you and your health needs are our priority.  As part of our continuing mission to provide you with exceptional heart care, we have created designated Provider Care Teams.  These Care Teams include your primary Cardiologist (physician) and Advanced Practice Providers (APPs -  Physician Assistants and Nurse Practitioners) who all work together to provide you with the care you need, when you need it.  Your next appointment:   1 year(s)  The format for your next appointment:   In Person  Provider:   Traci Turner, MD     Important Information About Sugar       

## 2022-05-05 DIAGNOSIS — E7800 Pure hypercholesterolemia, unspecified: Secondary | ICD-10-CM | POA: Diagnosis not present

## 2022-05-05 DIAGNOSIS — R7303 Prediabetes: Secondary | ICD-10-CM | POA: Diagnosis not present

## 2022-05-05 DIAGNOSIS — I2510 Atherosclerotic heart disease of native coronary artery without angina pectoris: Secondary | ICD-10-CM | POA: Diagnosis not present

## 2022-05-14 ENCOUNTER — Ambulatory Visit (HOSPITAL_COMMUNITY): Payer: Medicare Other | Attending: Internal Medicine

## 2022-05-14 DIAGNOSIS — R55 Syncope and collapse: Secondary | ICD-10-CM | POA: Diagnosis not present

## 2022-05-14 DIAGNOSIS — E7800 Pure hypercholesterolemia, unspecified: Secondary | ICD-10-CM | POA: Diagnosis not present

## 2022-05-14 DIAGNOSIS — I420 Dilated cardiomyopathy: Secondary | ICD-10-CM

## 2022-05-14 DIAGNOSIS — I493 Ventricular premature depolarization: Secondary | ICD-10-CM | POA: Diagnosis not present

## 2022-05-14 DIAGNOSIS — I4510 Unspecified right bundle-branch block: Secondary | ICD-10-CM

## 2022-05-14 DIAGNOSIS — I2510 Atherosclerotic heart disease of native coronary artery without angina pectoris: Secondary | ICD-10-CM | POA: Insufficient documentation

## 2022-05-14 LAB — ECHOCARDIOGRAM COMPLETE
Area-P 1/2: 1.29 cm2
P 1/2 time: 1342 msec
S' Lateral: 3.4 cm

## 2022-05-18 ENCOUNTER — Encounter: Payer: Self-pay | Admitting: Cardiology

## 2022-05-18 NOTE — Telephone Encounter (Signed)
-----   Message from Nuala Alpha, LPN sent at 5/92/9244 10:06 AM EDT -----  ----- Message ----- From: Sueanne Margarita, MD Sent: 05/16/2022   2:23 PM EDT To: [facility] Triage  2D echo showed normal LV function overall but slightly increased LV global strain.  Right ventricle mildly enlarged with low normal RV systolic function.  Right atrium mildly enlarged.  There is mild leakiness of the mitral valve.  There is mild leakiness of the aortic valve.  Compared to echo 2019, LV function has improved from 40 to 45% now to 55%.

## 2022-05-18 NOTE — Telephone Encounter (Signed)
The patient has been notified of the result and verbalized understanding.  All questions (if any) were answered. Antonieta Iba, RN 05/18/2022 2:11 PM

## 2022-05-21 DIAGNOSIS — F411 Generalized anxiety disorder: Secondary | ICD-10-CM | POA: Diagnosis not present

## 2022-06-18 DIAGNOSIS — F411 Generalized anxiety disorder: Secondary | ICD-10-CM | POA: Diagnosis not present

## 2022-07-02 DIAGNOSIS — N393 Stress incontinence (female) (male): Secondary | ICD-10-CM | POA: Diagnosis not present

## 2022-07-02 DIAGNOSIS — R3912 Poor urinary stream: Secondary | ICD-10-CM | POA: Diagnosis not present

## 2022-07-23 DIAGNOSIS — F411 Generalized anxiety disorder: Secondary | ICD-10-CM | POA: Diagnosis not present

## 2022-08-12 DIAGNOSIS — C61 Malignant neoplasm of prostate: Secondary | ICD-10-CM | POA: Diagnosis not present

## 2022-08-18 DIAGNOSIS — N393 Stress incontinence (female) (male): Secondary | ICD-10-CM | POA: Diagnosis not present

## 2022-08-18 DIAGNOSIS — C61 Malignant neoplasm of prostate: Secondary | ICD-10-CM | POA: Diagnosis not present

## 2022-08-27 DIAGNOSIS — D225 Melanocytic nevi of trunk: Secondary | ICD-10-CM | POA: Diagnosis not present

## 2022-08-27 DIAGNOSIS — L820 Inflamed seborrheic keratosis: Secondary | ICD-10-CM | POA: Diagnosis not present

## 2022-08-27 DIAGNOSIS — L814 Other melanin hyperpigmentation: Secondary | ICD-10-CM | POA: Diagnosis not present

## 2022-08-27 DIAGNOSIS — D492 Neoplasm of unspecified behavior of bone, soft tissue, and skin: Secondary | ICD-10-CM | POA: Diagnosis not present

## 2022-08-27 DIAGNOSIS — L821 Other seborrheic keratosis: Secondary | ICD-10-CM | POA: Diagnosis not present

## 2022-08-30 DIAGNOSIS — Z23 Encounter for immunization: Secondary | ICD-10-CM | POA: Diagnosis not present

## 2022-09-01 DIAGNOSIS — Z0000 Encounter for general adult medical examination without abnormal findings: Secondary | ICD-10-CM | POA: Diagnosis not present

## 2022-09-01 DIAGNOSIS — Z23 Encounter for immunization: Secondary | ICD-10-CM | POA: Diagnosis not present

## 2022-09-01 DIAGNOSIS — I2510 Atherosclerotic heart disease of native coronary artery without angina pectoris: Secondary | ICD-10-CM | POA: Diagnosis not present

## 2022-09-01 DIAGNOSIS — R7303 Prediabetes: Secondary | ICD-10-CM | POA: Diagnosis not present

## 2022-09-01 DIAGNOSIS — E7800 Pure hypercholesterolemia, unspecified: Secondary | ICD-10-CM | POA: Diagnosis not present

## 2022-09-01 DIAGNOSIS — D649 Anemia, unspecified: Secondary | ICD-10-CM | POA: Diagnosis not present

## 2022-09-01 DIAGNOSIS — Z6820 Body mass index (BMI) 20.0-20.9, adult: Secondary | ICD-10-CM | POA: Diagnosis not present

## 2022-09-01 DIAGNOSIS — E559 Vitamin D deficiency, unspecified: Secondary | ICD-10-CM | POA: Diagnosis not present

## 2022-09-11 DIAGNOSIS — F411 Generalized anxiety disorder: Secondary | ICD-10-CM | POA: Diagnosis not present

## 2022-10-08 DIAGNOSIS — F411 Generalized anxiety disorder: Secondary | ICD-10-CM | POA: Diagnosis not present

## 2022-10-09 DIAGNOSIS — D649 Anemia, unspecified: Secondary | ICD-10-CM | POA: Diagnosis not present

## 2022-10-12 DIAGNOSIS — D649 Anemia, unspecified: Secondary | ICD-10-CM | POA: Diagnosis not present

## 2022-11-05 DIAGNOSIS — F411 Generalized anxiety disorder: Secondary | ICD-10-CM | POA: Diagnosis not present

## 2022-11-25 DIAGNOSIS — D509 Iron deficiency anemia, unspecified: Secondary | ICD-10-CM | POA: Diagnosis not present

## 2022-11-25 DIAGNOSIS — K449 Diaphragmatic hernia without obstruction or gangrene: Secondary | ICD-10-CM | POA: Diagnosis not present

## 2022-11-25 DIAGNOSIS — K2930 Chronic superficial gastritis without bleeding: Secondary | ICD-10-CM | POA: Diagnosis not present

## 2022-11-25 DIAGNOSIS — K2970 Gastritis, unspecified, without bleeding: Secondary | ICD-10-CM | POA: Diagnosis not present

## 2022-12-01 DIAGNOSIS — K2930 Chronic superficial gastritis without bleeding: Secondary | ICD-10-CM | POA: Diagnosis not present

## 2022-12-03 DIAGNOSIS — F411 Generalized anxiety disorder: Secondary | ICD-10-CM | POA: Diagnosis not present

## 2022-12-18 DIAGNOSIS — D649 Anemia, unspecified: Secondary | ICD-10-CM | POA: Diagnosis not present

## 2022-12-22 DIAGNOSIS — H52203 Unspecified astigmatism, bilateral: Secondary | ICD-10-CM | POA: Diagnosis not present

## 2022-12-22 DIAGNOSIS — H25813 Combined forms of age-related cataract, bilateral: Secondary | ICD-10-CM | POA: Diagnosis not present

## 2022-12-22 DIAGNOSIS — H5213 Myopia, bilateral: Secondary | ICD-10-CM | POA: Diagnosis not present

## 2023-01-07 DIAGNOSIS — F411 Generalized anxiety disorder: Secondary | ICD-10-CM | POA: Diagnosis not present

## 2023-02-04 DIAGNOSIS — F411 Generalized anxiety disorder: Secondary | ICD-10-CM | POA: Diagnosis not present

## 2023-03-11 DIAGNOSIS — M2569 Stiffness of other specified joint, not elsewhere classified: Secondary | ICD-10-CM | POA: Diagnosis not present

## 2023-03-11 DIAGNOSIS — E7800 Pure hypercholesterolemia, unspecified: Secondary | ICD-10-CM | POA: Diagnosis not present

## 2023-03-11 DIAGNOSIS — I10 Essential (primary) hypertension: Secondary | ICD-10-CM | POA: Diagnosis not present

## 2023-03-11 DIAGNOSIS — F3341 Major depressive disorder, recurrent, in partial remission: Secondary | ICD-10-CM | POA: Diagnosis not present

## 2023-03-11 DIAGNOSIS — Z6820 Body mass index (BMI) 20.0-20.9, adult: Secondary | ICD-10-CM | POA: Diagnosis not present

## 2023-03-11 DIAGNOSIS — F411 Generalized anxiety disorder: Secondary | ICD-10-CM | POA: Diagnosis not present

## 2023-03-12 DIAGNOSIS — C61 Malignant neoplasm of prostate: Secondary | ICD-10-CM | POA: Diagnosis not present

## 2023-03-19 DIAGNOSIS — C61 Malignant neoplasm of prostate: Secondary | ICD-10-CM | POA: Diagnosis not present

## 2023-03-19 DIAGNOSIS — N5201 Erectile dysfunction due to arterial insufficiency: Secondary | ICD-10-CM | POA: Diagnosis not present

## 2023-04-08 DIAGNOSIS — D649 Anemia, unspecified: Secondary | ICD-10-CM | POA: Diagnosis not present

## 2023-04-22 DIAGNOSIS — F411 Generalized anxiety disorder: Secondary | ICD-10-CM | POA: Diagnosis not present

## 2023-05-03 DIAGNOSIS — I10 Essential (primary) hypertension: Secondary | ICD-10-CM | POA: Diagnosis not present

## 2023-05-03 DIAGNOSIS — E7800 Pure hypercholesterolemia, unspecified: Secondary | ICD-10-CM | POA: Diagnosis not present

## 2023-05-03 DIAGNOSIS — N400 Enlarged prostate without lower urinary tract symptoms: Secondary | ICD-10-CM | POA: Diagnosis not present

## 2023-05-03 DIAGNOSIS — I2510 Atherosclerotic heart disease of native coronary artery without angina pectoris: Secondary | ICD-10-CM | POA: Diagnosis not present

## 2023-05-20 DIAGNOSIS — F411 Generalized anxiety disorder: Secondary | ICD-10-CM | POA: Diagnosis not present

## 2023-05-25 DIAGNOSIS — R3912 Poor urinary stream: Secondary | ICD-10-CM | POA: Diagnosis not present

## 2023-05-25 DIAGNOSIS — N393 Stress incontinence (female) (male): Secondary | ICD-10-CM | POA: Diagnosis not present

## 2023-05-25 DIAGNOSIS — C61 Malignant neoplasm of prostate: Secondary | ICD-10-CM | POA: Diagnosis not present

## 2023-06-17 DIAGNOSIS — F411 Generalized anxiety disorder: Secondary | ICD-10-CM | POA: Diagnosis not present

## 2023-06-24 DIAGNOSIS — F411 Generalized anxiety disorder: Secondary | ICD-10-CM | POA: Diagnosis not present

## 2023-07-15 DIAGNOSIS — F411 Generalized anxiety disorder: Secondary | ICD-10-CM | POA: Diagnosis not present

## 2023-07-19 ENCOUNTER — Encounter: Payer: Self-pay | Admitting: Cardiology

## 2023-07-19 ENCOUNTER — Ambulatory Visit: Payer: Medicare Other | Attending: Cardiology | Admitting: Cardiology

## 2023-07-19 VITALS — BP 112/70 | HR 48 | Ht 65.0 in | Wt 123.0 lb

## 2023-07-19 DIAGNOSIS — I420 Dilated cardiomyopathy: Secondary | ICD-10-CM | POA: Diagnosis not present

## 2023-07-19 DIAGNOSIS — I2510 Atherosclerotic heart disease of native coronary artery without angina pectoris: Secondary | ICD-10-CM | POA: Insufficient documentation

## 2023-07-19 DIAGNOSIS — I4510 Unspecified right bundle-branch block: Secondary | ICD-10-CM | POA: Insufficient documentation

## 2023-07-19 DIAGNOSIS — I493 Ventricular premature depolarization: Secondary | ICD-10-CM | POA: Diagnosis not present

## 2023-07-19 NOTE — Progress Notes (Signed)
Cardiology Office Note:    Date:  07/19/2023   ID:  Patrick Barnes, DOB 01-Oct-1951, MRN [id number]  PCP:  Jackelyn Poling, DO  Cardiologist:  Armanda Magic, MD    Referring MD: No ref. provider found   Chief Complaint  Patient presents with   Coronary Artery Disease   Cardiomyopathy   Hyperlipidemia    History of Present Illness:    Patrick Barnes is a 72 y.o. male with a hx of ASCAD s/p PCI of LAD 2013, RVOT PVCs and VT s/p ablation, ischemic DCM EF 45-50%, micturition syncope and dyslipidemia. He is here today for followup and is doing well.  He denies any chest pain or pressure, SOB, DOE, PND, orthopnea, LE edema, dizziness, palpitations or syncope. He is compliant with his meds and is tolerating meds with no SE.    Past Medical History:  Diagnosis Date   Anxiety    Arthritis    hands   Bradycardia 07/23/2016   CAD (coronary artery disease) 01/2012   80% stenosis in proximal LAD with PCI   Cancer [facility])    prostate   Dilated cardiomyopathy (HCC)    Echo 11/2012 EF 45-50%   Dyslipidemia    Elevated CPK    Proboly due to excessive exercise.   Elevated LFTs    history of-Now resolved   H/O vasectomy    Hx of tonsillectomy    Hypogonadotropic hypogonadism (HCC)    Dr. Sharl Ma and Dr Annabell Howells   Inguinal hernia    Low bone density for age    endo is following   Low testosterone    PVC's (premature ventricular contractions)    s/p ablation Dr Johney Frame and Dr Mayford Knife   RBBB    Vitamin D deficiency     Past Surgical History:  Procedure Laterality Date   CARDIAC CATHETERIZATION     w 80% stenosis in proimal LAD w PCI on 2/13.   COLONOSCOPY  10/2005   had internal nonbleeding small hemorrhoids,repeat in 10 years.   CYSTECTOMY     EP study and ablation for RVOT PVCs  08/12/12   HERNIA REPAIR     Inguinal   LYMPHADENECTOMY Bilateral 02/10/2021   Procedure: LYMPHADENECTOMY, PELVIC;  Surgeon: Heloise Purpura, MD;  Location: [facility];  Service: Urology;  Laterality: Bilateral;    PERCUTANEOUS CORONARY STENT INTERVENTION (PCI-S) Bilateral 01/13/2012   Procedure: PERCUTANEOUS CORONARY STENT INTERVENTION (PCI-S);  Surgeon: Corky Crafts, MD;  Location: [facility] CATH LAB;  Service: Cardiovascular;  Laterality: Bilateral;  possible radial artery   ROBOT ASSISTED LAPAROSCOPIC RADICAL PROSTATECTOMY N/A 02/10/2021   Procedure: XI ROBOTIC ASSISTED LAPAROSCOPIC RADICAL PROSTATECTOMY LEVEL 2;  Surgeon: Heloise Purpura, MD;  Location: [facility];  Service: Urology;  Laterality: N/A;   TONSILLECTOMY     V-TACH ABLATION N/A 08/11/2012   Procedure: V-TACH ABLATION;  Surgeon: Hillis Range, MD;  Location: [facility] CATH LAB;  Service: Cardiovascular;  Laterality: N/A;   VASECTOMY      Current Medications: Current Meds  Medication Sig   acetaminophen (TYLENOL) 500 MG tablet Take 1,000 mg by mouth daily as needed. For sinus headaches   aspirin EC 81 MG tablet Take 81 mg by mouth daily. Swallow whole.   escitalopram (LEXAPRO) 10 MG tablet Take 10 mg by mouth daily.   nitroGLYCERIN (NITROSTAT) 0.4 MG SL tablet PLACE 1 TABLET UNDER TONGUE EVERY 5 MINUTES AS NEEDED FOR CHEST PAIN.[name] Kitchen MAXIMUM OF 3 DOSES   ramipril (ALTACE) 2.5 MG capsule Take 2.5 mg by mouth daily.  simvastatin (ZOCOR) 40 MG tablet Take 40 mg by mouth daily.   sulfamethoxazole-trimethoprim (BACTRIM DS) 800-160 MG tablet Take 1 tablet by mouth 2 (two) times daily. Start the day prior to foley removal appointment   Testosterone 10 MG/ACT (2%) GEL Place 4 Squirts onto the skin daily.   traMADol (ULTRAM) 50 MG tablet Take 1-2 tablets (50-100 mg total) by mouth every 6 (six) hours as needed for moderate pain or severe pain.     Allergies:   Quinolones   Social History   Socioeconomic History   Marital status: Married    Spouse name: Not on file   Number of children: Not on file   Years of education: Not on file   Highest education level: Not on file  Occupational History   Occupation: retired  Tobacco Use   Smoking status: Never    Smokeless tobacco: Never  Vaping Use   Vaping status: Never Used  Substance and Sexual Activity   Alcohol use: Yes    Comment: occasional   Drug use: No   Sexual activity: Not Currently  Other Topics Concern   Not on file  Social History Narrative   Not on file   Social Determinants of Health   Financial Resource Strain: Not on file  Food Insecurity: Not on file  Transportation Needs: Not on file  Physical Activity: Not on file  Stress: Not on file  Social Connections: Not on file     Family History: The patient's family history includes Cancer in his mother.  ROS:   Please see the history of present illness.    ROS  All other systems reviewed and negative.   EKGs/Labs/Other Studies Reviewed:    The following studies were reviewed today:   EKG Interpretation Date/Time:  Monday July 19 2023 14:16:08 EDT Ventricular Rate:  48 PR Interval:  170 QRS Duration:  142 QT Interval:  442 QTC Calculation: 394 R Axis:   -9  Text Interpretation: Sinus bradycardia Right bundle branch block When compared with ECG of 04-Nov-2020 18:38, PREVIOUS ECG IS PRESENT Confirmed by Armanda Magic ([id number]) on 07/19/2023 2:28:39 PM    Recent Labs: No results found for requested labs within last 365 days.   Recent Lipid Panel    Component Value Date/Time   CHOL 137 10/16/2014 0740   TRIG 46.0 10/16/2014 0740   HDL 60.90 10/16/2014 0740   CHOLHDL 2 10/16/2014 0740   VLDL 9.2 10/16/2014 0740   LDLCALC 67 10/16/2014 0740    Physical Exam:    VS:  BP 112/70 (BP Location: Left Arm, Patient Position: Sitting, Cuff Size: Normal)   Pulse (!) 48   Ht 5\' 5"  (1.651 m)   Wt 123 lb (55.8 kg)   BMI 20.47 kg/m     Wt Readings from Last 3 Encounters:  07/19/23 123 lb (55.8 kg)  04/23/22 123 lb 6.4 oz (56 kg)  02/10/21 119 lb 7.8 oz (54.2 kg)    GEN: Well nourished, well developed in no acute distress HEENT: Normal NECK: No JVD; No carotid bruits LYMPHATICS: No  lymphadenopathy CARDIAC:RRR, no murmurs, rubs, gallops RESPIRATORY:  Clear to auscultation without rales, wheezing or rhonchi  ABDOMEN: Soft, non-tender, non-distended MUSCULOSKELETAL:  No edema; No deformity  SKIN: Warm and dry NEUROLOGIC:  Alert and oriented x 3 PSYCHIATRIC:  Normal affect  ASSESSMENT:    1. Coronary artery disease involving native coronary artery of native heart without angina pectoris   2. Dilated cardiomyopathy (HCC)   3. PVC (premature ventricular  contraction)   4. RBBB     PLAN:    In order of problems listed above:  1.  ASCAD  -s/p PCI of LAD 2013.   -He has not had any anginal symptoms since I saw him last -Continue prescription drug management with aspirin 81 mg daily and simvastatin 40 mg daily with as needed refills  2.  DCM  -EF 45-50% by echo 2019 with diffuse HK and G2DD -EF 55 to 60% on echo 2023 -He appears euvolemic on exam today -Continue prescription drug management with Altace 2.5mg  daily with PRN refills -I have personally reviewed and interpreted outside labs performed by patient's PCP which showed SCr 0.94 and K+ 4.6 on 03/2023  3.  PVCs and VT  -s/p ablation  -He has not had any palpitations  4.  Hyperlipidemia  -LDL goal < 70.   -I have personally reviewed and interpreted outside labs performed by patient's PCP which showed LDL 68, HDL 70 on 03/11/2023 -Continue prescription drug management with simvastatin 40 mg daily with as needed refills  5.  RBBB  -he is asymptomatic and is very active riding his stationary 2.5 hrs per day but not straight in a ros -stress myoview 2019 with no ischemia  6.  Syncope -felt to be micturition induced -he was bradycardic in ER in the 30's but normally runs 40-50's -He denies recent dizziness or syncope -30-day event monitor showed Normal sinus rhythm and a rare PAC with average heart rate 63 bpm -HR 48bpm today and asymptomatic  Followup:  1 year  Medication Adjustments/Labs and Tests  Ordered: Current medicines are reviewed at length with the patient today.  Concerns regarding medicines are outlined above.  Orders Placed This Encounter  Procedures   EKG 12-Lead   No orders of the defined types were placed in this encounter.   Signed, Armanda Magic, MD  07/19/2023 2:26 PM    [facility]

## 2023-07-19 NOTE — Patient Instructions (Signed)
Medication Instructions:  Your physician recommends that you continue on your current medications as directed. Please refer to the Current Medication list given to you today.  *If you need a refill on your cardiac medications before your next appointment, please call your pharmacy*   Lab Work: None.  If you have labs (blood work) drawn today and your tests are completely normal, you will receive your results only by: MyChart Message (if you have MyChart) OR A paper copy in the mail If you have any lab test that is abnormal or we need to change your treatment, we will call you to review the results.   Testing/Procedures: None.   Follow-Up: At [facility], you and your health needs are our priority.  As part of our continuing mission to provide you with exceptional heart care, we have created designated Provider Care Teams.  These Care Teams include your primary Cardiologist (physician) and Advanced Practice Providers (APPs -  Physician Assistants and Nurse Practitioners) who all work together to provide you with the care you need, when you need it.  We recommend signing up for the patient portal called "MyChart".  Sign up information is provided on this After Visit Summary.  MyChart is used to connect with patients for Virtual Visits (Telemedicine).  Patients are able to view lab/test results, encounter notes, upcoming appointments, etc.  Non-urgent messages can be sent to your provider as well.   To learn more about what you can do with MyChart, go to [email or web address].    Your next appointment:   1 year(s)  Provider:   Traci Turner, MD     

## 2023-07-27 DIAGNOSIS — J189 Pneumonia, unspecified organism: Secondary | ICD-10-CM | POA: Diagnosis not present

## 2023-07-27 DIAGNOSIS — I10 Essential (primary) hypertension: Secondary | ICD-10-CM | POA: Diagnosis not present

## 2023-07-27 DIAGNOSIS — R053 Chronic cough: Secondary | ICD-10-CM | POA: Diagnosis not present

## 2023-07-27 DIAGNOSIS — R918 Other nonspecific abnormal finding of lung field: Secondary | ICD-10-CM | POA: Diagnosis not present

## 2023-07-29 ENCOUNTER — Other Ambulatory Visit: Payer: Self-pay | Admitting: Family Medicine

## 2023-07-29 ENCOUNTER — Ambulatory Visit
Admission: RE | Admit: 2023-07-29 | Discharge: 2023-07-29 | Disposition: A | Discharge status: Home / Self Care | Payer: Medicare Other | Source: Ambulatory Visit | Attending: Family Medicine | Admitting: Family Medicine

## 2023-07-29 DIAGNOSIS — R053 Chronic cough: Secondary | ICD-10-CM | POA: Diagnosis not present

## 2023-07-29 DIAGNOSIS — R918 Other nonspecific abnormal finding of lung field: Secondary | ICD-10-CM | POA: Diagnosis not present

## 2023-07-29 DIAGNOSIS — J189 Pneumonia, unspecified organism: Secondary | ICD-10-CM | POA: Diagnosis not present

## 2023-07-30 ENCOUNTER — Other Ambulatory Visit: Payer: Self-pay | Admitting: Family Medicine

## 2023-07-30 DIAGNOSIS — R918 Other nonspecific abnormal finding of lung field: Secondary | ICD-10-CM

## 2023-08-12 DIAGNOSIS — F411 Generalized anxiety disorder: Secondary | ICD-10-CM | POA: Diagnosis not present

## 2023-08-17 ENCOUNTER — Ambulatory Visit
Admission: RE | Admit: 2023-08-17 | Discharge: 2023-08-17 | Disposition: A | Discharge status: Home / Self Care | Payer: Medicare Other | Source: Ambulatory Visit | Attending: Family Medicine

## 2023-08-17 DIAGNOSIS — R053 Chronic cough: Secondary | ICD-10-CM | POA: Diagnosis not present

## 2023-08-17 DIAGNOSIS — R918 Other nonspecific abnormal finding of lung field: Secondary | ICD-10-CM

## 2023-08-23 ENCOUNTER — Other Ambulatory Visit: Payer: Medicare Other

## 2023-08-26 DIAGNOSIS — Z23 Encounter for immunization: Secondary | ICD-10-CM | POA: Diagnosis not present

## 2023-09-07 DIAGNOSIS — M7541 Impingement syndrome of right shoulder: Secondary | ICD-10-CM | POA: Diagnosis not present

## 2023-09-07 DIAGNOSIS — E559 Vitamin D deficiency, unspecified: Secondary | ICD-10-CM | POA: Diagnosis not present

## 2023-09-07 DIAGNOSIS — R7303 Prediabetes: Secondary | ICD-10-CM | POA: Diagnosis not present

## 2023-09-07 DIAGNOSIS — R918 Other nonspecific abnormal finding of lung field: Secondary | ICD-10-CM | POA: Diagnosis not present

## 2023-09-07 DIAGNOSIS — I2510 Atherosclerotic heart disease of native coronary artery without angina pectoris: Secondary | ICD-10-CM | POA: Diagnosis not present

## 2023-09-07 DIAGNOSIS — E7800 Pure hypercholesterolemia, unspecified: Secondary | ICD-10-CM | POA: Diagnosis not present

## 2023-09-07 DIAGNOSIS — F3341 Major depressive disorder, recurrent, in partial remission: Secondary | ICD-10-CM | POA: Diagnosis not present

## 2023-09-07 DIAGNOSIS — Z0000 Encounter for general adult medical examination without abnormal findings: Secondary | ICD-10-CM | POA: Diagnosis not present

## 2023-09-13 DIAGNOSIS — C61 Malignant neoplasm of prostate: Secondary | ICD-10-CM | POA: Diagnosis not present

## 2023-09-16 DIAGNOSIS — F411 Generalized anxiety disorder: Secondary | ICD-10-CM | POA: Diagnosis not present

## 2023-10-21 DIAGNOSIS — F411 Generalized anxiety disorder: Secondary | ICD-10-CM | POA: Diagnosis not present

## 2023-11-18 DIAGNOSIS — F411 Generalized anxiety disorder: Secondary | ICD-10-CM | POA: Diagnosis not present

## 2023-12-23 DIAGNOSIS — H31003 Unspecified chorioretinal scars, bilateral: Secondary | ICD-10-CM | POA: Diagnosis not present

## 2023-12-23 DIAGNOSIS — H52203 Unspecified astigmatism, bilateral: Secondary | ICD-10-CM | POA: Diagnosis not present

## 2023-12-23 DIAGNOSIS — F411 Generalized anxiety disorder: Secondary | ICD-10-CM | POA: Diagnosis not present

## 2023-12-23 DIAGNOSIS — H5213 Myopia, bilateral: Secondary | ICD-10-CM | POA: Diagnosis not present

## 2023-12-23 DIAGNOSIS — H2513 Age-related nuclear cataract, bilateral: Secondary | ICD-10-CM | POA: Diagnosis not present

## 2024-01-20 DIAGNOSIS — F411 Generalized anxiety disorder: Secondary | ICD-10-CM | POA: Diagnosis not present

## 2024-02-24 DIAGNOSIS — F411 Generalized anxiety disorder: Secondary | ICD-10-CM | POA: Diagnosis not present

## 2024-03-06 ENCOUNTER — Other Ambulatory Visit: Payer: Self-pay | Admitting: Family Medicine

## 2024-03-06 DIAGNOSIS — G8929 Other chronic pain: Secondary | ICD-10-CM | POA: Diagnosis not present

## 2024-03-06 DIAGNOSIS — F3342 Major depressive disorder, recurrent, in full remission: Secondary | ICD-10-CM | POA: Diagnosis not present

## 2024-03-06 DIAGNOSIS — M25511 Pain in right shoulder: Secondary | ICD-10-CM | POA: Diagnosis not present

## 2024-03-06 DIAGNOSIS — R918 Other nonspecific abnormal finding of lung field: Secondary | ICD-10-CM | POA: Diagnosis not present

## 2024-03-06 DIAGNOSIS — E559 Vitamin D deficiency, unspecified: Secondary | ICD-10-CM | POA: Diagnosis not present

## 2024-03-06 DIAGNOSIS — I10 Essential (primary) hypertension: Secondary | ICD-10-CM | POA: Diagnosis not present

## 2024-03-06 DIAGNOSIS — E7800 Pure hypercholesterolemia, unspecified: Secondary | ICD-10-CM | POA: Diagnosis not present

## 2024-03-09 DIAGNOSIS — Z23 Encounter for immunization: Secondary | ICD-10-CM | POA: Diagnosis not present

## 2024-03-16 DIAGNOSIS — C61 Malignant neoplasm of prostate: Secondary | ICD-10-CM | POA: Diagnosis not present

## 2024-03-23 DIAGNOSIS — F411 Generalized anxiety disorder: Secondary | ICD-10-CM | POA: Diagnosis not present

## 2024-03-24 ENCOUNTER — Ambulatory Visit
Admission: RE | Admit: 2024-03-24 | Discharge: 2024-03-24 | Disposition: A | Discharge status: Home / Self Care | Source: Ambulatory Visit | Attending: Family Medicine | Admitting: Family Medicine

## 2024-03-24 DIAGNOSIS — R918 Other nonspecific abnormal finding of lung field: Secondary | ICD-10-CM | POA: Diagnosis not present

## 2024-03-24 DIAGNOSIS — I700 Atherosclerosis of aorta: Secondary | ICD-10-CM | POA: Diagnosis not present

## 2024-03-24 DIAGNOSIS — C61 Malignant neoplasm of prostate: Secondary | ICD-10-CM | POA: Diagnosis not present

## 2024-03-24 MED ORDER — IOPAMIDOL (ISOVUE-300) INJECTION 61%
500.0000 mL | Freq: Once | INTRAVENOUS | Status: AC | PRN
  Administered 2024-03-24: 75 mL via INTRAVENOUS

## 2024-03-29 DIAGNOSIS — E7800 Pure hypercholesterolemia, unspecified: Secondary | ICD-10-CM | POA: Diagnosis not present

## 2024-03-29 DIAGNOSIS — J189 Pneumonia, unspecified organism: Secondary | ICD-10-CM | POA: Diagnosis not present

## 2024-03-29 DIAGNOSIS — I2510 Atherosclerotic heart disease of native coronary artery without angina pectoris: Secondary | ICD-10-CM | POA: Diagnosis not present

## 2024-03-29 DIAGNOSIS — F3341 Major depressive disorder, recurrent, in partial remission: Secondary | ICD-10-CM | POA: Diagnosis not present

## 2024-04-06 DIAGNOSIS — M25511 Pain in right shoulder: Secondary | ICD-10-CM | POA: Diagnosis not present

## 2024-04-17 DIAGNOSIS — M25511 Pain in right shoulder: Secondary | ICD-10-CM | POA: Diagnosis not present

## 2024-04-20 DIAGNOSIS — F411 Generalized anxiety disorder: Secondary | ICD-10-CM | POA: Diagnosis not present

## 2024-04-26 DIAGNOSIS — M25511 Pain in right shoulder: Secondary | ICD-10-CM | POA: Diagnosis not present

## 2024-05-05 DIAGNOSIS — M25511 Pain in right shoulder: Secondary | ICD-10-CM | POA: Diagnosis not present

## 2024-05-10 DIAGNOSIS — M25511 Pain in right shoulder: Secondary | ICD-10-CM | POA: Diagnosis not present

## 2024-05-11 DIAGNOSIS — F411 Generalized anxiety disorder: Secondary | ICD-10-CM | POA: Diagnosis not present

## 2024-05-17 DIAGNOSIS — L821 Other seborrheic keratosis: Secondary | ICD-10-CM | POA: Diagnosis not present

## 2024-05-17 DIAGNOSIS — M25511 Pain in right shoulder: Secondary | ICD-10-CM | POA: Diagnosis not present

## 2024-05-17 DIAGNOSIS — D225 Melanocytic nevi of trunk: Secondary | ICD-10-CM | POA: Diagnosis not present

## 2024-05-17 DIAGNOSIS — L814 Other melanin hyperpigmentation: Secondary | ICD-10-CM | POA: Diagnosis not present

## 2024-05-17 DIAGNOSIS — L608 Other nail disorders: Secondary | ICD-10-CM | POA: Diagnosis not present

## 2024-05-25 DIAGNOSIS — M25511 Pain in right shoulder: Secondary | ICD-10-CM | POA: Diagnosis not present

## 2024-05-29 DIAGNOSIS — E7800 Pure hypercholesterolemia, unspecified: Secondary | ICD-10-CM | POA: Diagnosis not present

## 2024-05-29 DIAGNOSIS — J189 Pneumonia, unspecified organism: Secondary | ICD-10-CM | POA: Diagnosis not present

## 2024-05-29 DIAGNOSIS — F3341 Major depressive disorder, recurrent, in partial remission: Secondary | ICD-10-CM | POA: Diagnosis not present

## 2024-05-29 DIAGNOSIS — I2510 Atherosclerotic heart disease of native coronary artery without angina pectoris: Secondary | ICD-10-CM | POA: Diagnosis not present

## 2024-06-15 DIAGNOSIS — F411 Generalized anxiety disorder: Secondary | ICD-10-CM | POA: Diagnosis not present

## 2024-06-29 DIAGNOSIS — J189 Pneumonia, unspecified organism: Secondary | ICD-10-CM | POA: Diagnosis not present

## 2024-06-29 DIAGNOSIS — I2510 Atherosclerotic heart disease of native coronary artery without angina pectoris: Secondary | ICD-10-CM | POA: Diagnosis not present

## 2024-06-29 DIAGNOSIS — F3341 Major depressive disorder, recurrent, in partial remission: Secondary | ICD-10-CM | POA: Diagnosis not present

## 2024-06-29 DIAGNOSIS — E7800 Pure hypercholesterolemia, unspecified: Secondary | ICD-10-CM | POA: Diagnosis not present

## 2024-07-13 DIAGNOSIS — F411 Generalized anxiety disorder: Secondary | ICD-10-CM | POA: Diagnosis not present

## 2024-07-30 DIAGNOSIS — E7800 Pure hypercholesterolemia, unspecified: Secondary | ICD-10-CM | POA: Diagnosis not present

## 2024-07-30 DIAGNOSIS — F3341 Major depressive disorder, recurrent, in partial remission: Secondary | ICD-10-CM | POA: Diagnosis not present

## 2024-07-30 DIAGNOSIS — J189 Pneumonia, unspecified organism: Secondary | ICD-10-CM | POA: Diagnosis not present

## 2024-07-30 DIAGNOSIS — I2510 Atherosclerotic heart disease of native coronary artery without angina pectoris: Secondary | ICD-10-CM | POA: Diagnosis not present

## 2024-08-10 DIAGNOSIS — F411 Generalized anxiety disorder: Secondary | ICD-10-CM | POA: Diagnosis not present

## 2024-08-10 DIAGNOSIS — Z23 Encounter for immunization: Secondary | ICD-10-CM | POA: Diagnosis not present

## 2024-08-29 DIAGNOSIS — E7800 Pure hypercholesterolemia, unspecified: Secondary | ICD-10-CM | POA: Diagnosis not present

## 2024-08-29 DIAGNOSIS — I2510 Atherosclerotic heart disease of native coronary artery without angina pectoris: Secondary | ICD-10-CM | POA: Diagnosis not present

## 2024-08-29 DIAGNOSIS — J189 Pneumonia, unspecified organism: Secondary | ICD-10-CM | POA: Diagnosis not present

## 2024-08-29 DIAGNOSIS — F3341 Major depressive disorder, recurrent, in partial remission: Secondary | ICD-10-CM | POA: Diagnosis not present

## 2024-09-07 DIAGNOSIS — B353 Tinea pedis: Secondary | ICD-10-CM | POA: Diagnosis not present

## 2024-09-07 DIAGNOSIS — R001 Bradycardia, unspecified: Secondary | ICD-10-CM | POA: Diagnosis not present

## 2024-09-07 DIAGNOSIS — I2510 Atherosclerotic heart disease of native coronary artery without angina pectoris: Secondary | ICD-10-CM | POA: Diagnosis not present

## 2024-09-07 DIAGNOSIS — R7303 Prediabetes: Secondary | ICD-10-CM | POA: Diagnosis not present

## 2024-09-07 DIAGNOSIS — E7800 Pure hypercholesterolemia, unspecified: Secondary | ICD-10-CM | POA: Diagnosis not present

## 2024-09-07 DIAGNOSIS — E559 Vitamin D deficiency, unspecified: Secondary | ICD-10-CM | POA: Diagnosis not present

## 2024-09-07 DIAGNOSIS — Z6820 Body mass index (BMI) 20.0-20.9, adult: Secondary | ICD-10-CM | POA: Diagnosis not present

## 2024-09-07 DIAGNOSIS — F3342 Major depressive disorder, recurrent, in full remission: Secondary | ICD-10-CM | POA: Diagnosis not present

## 2024-09-07 DIAGNOSIS — R918 Other nonspecific abnormal finding of lung field: Secondary | ICD-10-CM | POA: Diagnosis not present

## 2024-09-07 DIAGNOSIS — Z0000 Encounter for general adult medical examination without abnormal findings: Secondary | ICD-10-CM | POA: Diagnosis not present

## 2024-09-07 DIAGNOSIS — I10 Essential (primary) hypertension: Secondary | ICD-10-CM | POA: Diagnosis not present

## 2024-09-08 ENCOUNTER — Other Ambulatory Visit (HOSPITAL_COMMUNITY): Payer: Self-pay | Admitting: Family Medicine

## 2024-09-08 DIAGNOSIS — R918 Other nonspecific abnormal finding of lung field: Secondary | ICD-10-CM

## 2024-09-14 DIAGNOSIS — F411 Generalized anxiety disorder: Secondary | ICD-10-CM | POA: Diagnosis not present

## 2024-09-22 ENCOUNTER — Ambulatory Visit (HOSPITAL_COMMUNITY)
Admission: RE | Admit: 2024-09-22 | Discharge: 2024-09-22 | Disposition: A | Discharge status: Home / Self Care | Source: Ambulatory Visit | Attending: Family Medicine | Admitting: Family Medicine

## 2024-09-22 DIAGNOSIS — R918 Other nonspecific abnormal finding of lung field: Secondary | ICD-10-CM | POA: Insufficient documentation

## 2024-09-22 DIAGNOSIS — I700 Atherosclerosis of aorta: Secondary | ICD-10-CM | POA: Diagnosis not present

## 2024-09-29 DIAGNOSIS — I2510 Atherosclerotic heart disease of native coronary artery without angina pectoris: Secondary | ICD-10-CM | POA: Diagnosis not present

## 2024-09-29 DIAGNOSIS — J189 Pneumonia, unspecified organism: Secondary | ICD-10-CM | POA: Diagnosis not present

## 2024-09-29 DIAGNOSIS — F3341 Major depressive disorder, recurrent, in partial remission: Secondary | ICD-10-CM | POA: Diagnosis not present

## 2024-09-29 DIAGNOSIS — E7800 Pure hypercholesterolemia, unspecified: Secondary | ICD-10-CM | POA: Diagnosis not present

## 2024-10-12 DIAGNOSIS — F411 Generalized anxiety disorder: Secondary | ICD-10-CM | POA: Diagnosis not present

## 2024-10-29 DIAGNOSIS — J189 Pneumonia, unspecified organism: Secondary | ICD-10-CM | POA: Diagnosis not present

## 2024-10-29 DIAGNOSIS — E7800 Pure hypercholesterolemia, unspecified: Secondary | ICD-10-CM | POA: Diagnosis not present

## 2024-10-29 DIAGNOSIS — F3341 Major depressive disorder, recurrent, in partial remission: Secondary | ICD-10-CM | POA: Diagnosis not present

## 2024-10-29 DIAGNOSIS — I2510 Atherosclerotic heart disease of native coronary artery without angina pectoris: Secondary | ICD-10-CM | POA: Diagnosis not present

## 2024-11-16 DIAGNOSIS — F411 Generalized anxiety disorder: Secondary | ICD-10-CM | POA: Diagnosis not present

## 2024-12-21 ENCOUNTER — Ambulatory Visit: Admitting: Cardiology

## 2024-12-21 NOTE — Progress Notes (Unsigned)
 " Cardiology Office Note:    Date:  12/21/2024   ID:  Simone Tuckey, DOB [birth year], MRN [id number]  PCP:  Dayna Motto, DO  Cardiologist:  Wilbert Bihari, MD    Referring MD: Dayna Motto, DO   No chief complaint on file.   History of Present Illness:    Patrick Barnes is a 74 y.o. male with a hx of ASCAD s/p PCI of LAD 2013, RVOT PVCs and VT s/p ablation, ischemic DCM EF 45-50%, micturition syncope and dyslipidemia. The patient is here today and is doing well.  He denies any CP or pressure, SOB, DOE, PND, orthopnea, LE edema, dizziness, palpitations or syncope.   Past Medical History:  Diagnosis Date   Anxiety    Arthritis    hands   Bradycardia 07/23/2016   CAD (coronary artery disease) 01/2012   80% stenosis in proximal LAD with PCI   Cancer [facility])    prostate   Dilated cardiomyopathy (HCC)    Echo 11/2012 EF 45-50%   Dyslipidemia    Elevated CPK    Proboly due to excessive exercise.   Elevated LFTs    history of-Now resolved   H/O vasectomy    Hx of tonsillectomy    Hypogonadotropic hypogonadism    Dr. Faythe and Dr Watt   Inguinal hernia    Low bone density for age    endo is following   Low testosterone     PVC's (premature ventricular contractions)    s/p ablation Dr Kelsie and Dr Bihari   RBBB    Vitamin D deficiency     Past Surgical History:  Procedure Laterality Date   CARDIAC CATHETERIZATION     w 80% stenosis in proimal LAD w PCI on 2/13.   COLONOSCOPY  10/2005   had internal nonbleeding small hemorrhoids,repeat in 10 years.   CYSTECTOMY     EP study and ablation for RVOT PVCs  08/12/12   HERNIA REPAIR     Inguinal   LYMPHADENECTOMY Bilateral 02/10/2021   Procedure: LYMPHADENECTOMY, PELVIC;  Surgeon: Renda Glance, MD;  Location: [facility];  Service: Urology;  Laterality: Bilateral;   PERCUTANEOUS CORONARY STENT INTERVENTION (PCI-S) Bilateral 01/13/2012   Procedure: PERCUTANEOUS CORONARY STENT INTERVENTION (PCI-S);  Surgeon: Candyce GORMAN Reek, MD;   Location: [facility] CATH LAB;  Service: Cardiovascular;  Laterality: Bilateral;  possible radial artery   ROBOT ASSISTED LAPAROSCOPIC RADICAL PROSTATECTOMY N/A 02/10/2021   Procedure: XI ROBOTIC ASSISTED LAPAROSCOPIC RADICAL PROSTATECTOMY LEVEL 2;  Surgeon: Renda Glance, MD;  Location: [facility];  Service: Urology;  Laterality: N/A;   TONSILLECTOMY     V-TACH ABLATION N/A 08/11/2012   Procedure: V-TACH ABLATION;  Surgeon: Lynwood Kelsie, MD;  Location: [facility] CATH LAB;  Service: Cardiovascular;  Laterality: N/A;   VASECTOMY      Current Medications: No outpatient medications have been marked as taking for the 12/21/24 encounter (Appointment) with Bihari Wilbert SAUNDERS, MD.     Allergies:   Quinolones   Social History   Socioeconomic History   Marital status: Married    Spouse name: Not on file   Number of children: Not on file   Years of education: Not on file   Highest education level: Not on file  Occupational History   Occupation: retired  Tobacco Use   Smoking status: Never   Smokeless tobacco: Never  Vaping Use   Vaping status: Never Used  Substance and Sexual Activity   Alcohol use: Yes    Comment: occasional   Drug use: No  Sexual activity: Not Currently  Other Topics Concern   Not on file  Social History Narrative   Not on file   Social Drivers of Health   Tobacco Use: Low Risk (07/19/2023)   Patient History    Smoking Tobacco Use: Never    Smokeless Tobacco Use: Never    Passive Exposure: Not on file  Financial Resource Strain: Not on file  Food Insecurity: Not on file  Transportation Needs: Not on file  Physical Activity: Not on file  Stress: Not on file  Social Connections: Not on file  Depression (EYV7-0): Not on file  Alcohol Screen: Not on file  Housing: Not on file  Utilities: Not on file  Health Literacy: Not on file     Family History: The patient's family history includes Cancer in his mother.  ROS:   Please see the history of present illness.    ROS  All  other systems reviewed and negative.   EKGs/Labs/Other Studies Reviewed:    The following studies were reviewed today:        Recent Labs: No results found for requested labs within last 365 days.   Recent Lipid Panel    Component Value Date/Time   CHOL 137 10/16/2014 0740   TRIG 46.0 10/16/2014 0740   HDL 60.90 10/16/2014 0740   CHOLHDL 2 10/16/2014 0740   VLDL 9.2 10/16/2014 0740   LDLCALC 67 10/16/2014 0740    Physical Exam:    VS:  There were no vitals taken for this visit.    Wt Readings from Last 3 Encounters:  07/19/23 123 lb (55.8 kg)  04/23/22 123 lb 6.4 oz (56 kg)  02/10/21 119 lb 7.8 oz (54.2 kg)    GEN: Well nourished, well developed in no acute distress HEENT: Normal NECK: No JVD; No carotid bruits LYMPHATICS: No lymphadenopathy CARDIAC:RRR, no murmurs, rubs, gallops RESPIRATORY:  Clear to auscultation without rales, wheezing or rhonchi  ABDOMEN: Soft, non-tender, non-distended MUSCULOSKELETAL:  No edema; No deformity  SKIN: Warm and dry NEUROLOGIC:  Alert and oriented x 3 PSYCHIATRIC:  Normal affect  ASSESSMENT:    1. Coronary artery disease involving native coronary artery of native heart without angina pectoris   2. Dilated cardiomyopathy (HCC)   3. PVC (premature ventricular contraction)   4. Hyperlipidemia LDL goal <70   5. RBBB   6. Vasovagal syncope      PLAN:    In order of problems listed above:  ASCAD  -s/p PCI of LAD 2013.   -Denies any anginal symptoms since I saw him last -Continue aspirin  81 mg daily, Zocor  40 mg daily with as needed refills  DCM  -EF 45-50% by echo 2019 with diffuse HK and G2DD -EF 55 to 60% on echo 2023 -He appears euvolemic on exam today -Continue ramipril  2.5 mg daily with as needed refills -I have personally reviewed and interpreted outside labs performed by patient's PCP which showed ***  PVCs and VT  -s/p ablation  - Okay denies any recent palpitations  Hyperlipidemia  -LDL goal < 70.   -I  have personally reviewed and interpreted outside labs performed by patient's PCP which showed *** -Continue simvastatin  40 mg daily with as needed refills  RBBB  -he is asymptomatic and is very active riding his stationary 2.5 hrs per day but not straight in a ros -stress myoview  2019 with no ischemia  Syncope -felt to be micturition induced -he was bradycardic in ER in the 30's but normally runs 40-50's -He has  had no further episodes of dizziness or syncope -30-day event monitor showed Normal sinus rhythm and a rare PAC with average heart rate 63 bpm  Followup:  1 year  Medication Adjustments/Labs and Tests Ordered: Current medicines are reviewed at length with the patient today.  Concerns regarding medicines are outlined above.  No orders of the defined types were placed in this encounter.  No orders of the defined types were placed in this encounter.   Signed, Wilbert Bihari, MD  12/21/2024 1:51 PM    [facility] "

## 2024-12-29 ENCOUNTER — Encounter: Payer: Self-pay | Admitting: Cardiology

## 2024-12-29 ENCOUNTER — Ambulatory Visit: Attending: Cardiology | Admitting: Cardiology

## 2024-12-29 VITALS — BP 122/70 | HR 62 | Resp 18 | Ht 65.0 in | Wt 127.0 lb

## 2024-12-29 DIAGNOSIS — E785 Hyperlipidemia, unspecified: Secondary | ICD-10-CM | POA: Diagnosis present

## 2024-12-29 DIAGNOSIS — I2510 Atherosclerotic heart disease of native coronary artery without angina pectoris: Secondary | ICD-10-CM | POA: Diagnosis present

## 2024-12-29 DIAGNOSIS — I493 Ventricular premature depolarization: Secondary | ICD-10-CM | POA: Diagnosis present

## 2024-12-29 DIAGNOSIS — I420 Dilated cardiomyopathy: Secondary | ICD-10-CM | POA: Insufficient documentation

## 2024-12-29 MED ORDER — ROSUVASTATIN CALCIUM 40 MG PO TABS: 40.0000 mg | ORAL_TABLET | Freq: Every day | ORAL | 3 refills | Status: AC

## 2024-12-29 NOTE — Addendum Note (Signed)
 Addended by: LORRENE FEDERICO CROME on: 12/29/2024 08:50 AM   Modules accepted: Orders

## 2024-12-29 NOTE — Progress Notes (Deleted)
 " Cardiology Office Note:    Date:  12/29/2024   ID:  Patrick Barnes, DOB 1951/02/28, MRN [id number]  PCP:  Dayna Motto, DO  Cardiologist:  Wilbert Bihari, MD    Referring MD: Dayna Motto, DO   Chief Complaint  Patient presents with   Coronary artery disease involving native coronary artery of   Follow-up    1 year    History of Present Illness:    Patrick Barnes is a 74 y.o. male with a hx of ASCAD s/p PCI of LAD 2013, RVOT PVCs and VT s/p ablation, ischemic DCM EF 45-50%, micturition syncope and dyslipidemia. The patient is here today and is doing well.  He denies any CP or pressure, SOB, DOE, PND, orthopnea, LE edema, dizziness, palpitations or syncope.   Past Medical History:  Diagnosis Date   Anxiety    Arthritis    hands   Bradycardia 07/23/2016   CAD (coronary artery disease) 01/2012   80% stenosis in proximal LAD with PCI   Cancer [facility])    prostate   Dilated cardiomyopathy (HCC)    Echo 11/2012 EF 45-50%   Dyslipidemia    Elevated CPK    Proboly due to excessive exercise.   Elevated LFTs    history of-Now resolved   H/O vasectomy    Hx of tonsillectomy    Hypogonadotropic hypogonadism    Dr. Faythe and Dr Watt   Inguinal hernia    Low bone density for age    endo is following   Low testosterone     PVC's (premature ventricular contractions)    s/p ablation Dr Kelsie and Dr Bihari   RBBB    Vitamin D deficiency     Past Surgical History:  Procedure Laterality Date   CARDIAC CATHETERIZATION     w 80% stenosis in proimal LAD w PCI on 2/13.   COLONOSCOPY  10/2005   had internal nonbleeding small hemorrhoids,repeat in 10 years.   CYSTECTOMY     EP study and ablation for RVOT PVCs  08/12/12   HERNIA REPAIR     Inguinal   LYMPHADENECTOMY Bilateral 02/10/2021   Procedure: LYMPHADENECTOMY, PELVIC;  Surgeon: Renda Glance, MD;  Location: [facility];  Service: Urology;  Laterality: Bilateral;   PERCUTANEOUS CORONARY STENT INTERVENTION (PCI-S) Bilateral 01/13/2012    Procedure: PERCUTANEOUS CORONARY STENT INTERVENTION (PCI-S);  Surgeon: Candyce GORMAN Reek, MD;  Location: [facility] CATH LAB;  Service: Cardiovascular;  Laterality: Bilateral;  possible radial artery   ROBOT ASSISTED LAPAROSCOPIC RADICAL PROSTATECTOMY N/A 02/10/2021   Procedure: XI ROBOTIC ASSISTED LAPAROSCOPIC RADICAL PROSTATECTOMY LEVEL 2;  Surgeon: Renda Glance, MD;  Location: [facility];  Service: Urology;  Laterality: N/A;   TONSILLECTOMY     V-TACH ABLATION N/A 08/11/2012   Procedure: V-TACH ABLATION;  Surgeon: Lynwood Kelsie, MD;  Location[facility] CATH LAB;  Service: Cardiovascular;  Laterality: N/A;   VASECTOMY      Current Medications: Current Meds  Medication Sig   acetaminophen  (TYLENOL ) 500 MG tablet Take 1,000 mg by mouth daily as needed. For sinus headaches   aspirin  EC 81 MG tablet Take 81 mg by mouth daily. Swallow whole.   cholecalciferol (VITAMIN D3) 25 MCG (1000 UNIT) tablet Take 1,000 Units by mouth every other day.   escitalopram  (LEXAPRO ) 10 MG tablet Take 10 mg by mouth daily.   nitroGLYCERIN  (NITROSTAT ) 0.4 MG SL tablet PLACE 1 TABLET UNDER TONGUE EVERY 5 MINUTES AS NEEDED FOR CHEST PAIN.SABRA MAXIMUM OF 3 DOSES   ramipril  (ALTACE ) 2.5 MG capsule  Take 2.5 mg by mouth daily.    simvastatin  (ZOCOR ) 40 MG tablet Take 40 mg by mouth daily.     Allergies:   Quinolones   Social History   Socioeconomic History   Marital status: Married    Spouse name: Not on file   Number of children: Not on file   Years of education: Not on file   Highest education level: Not on file  Occupational History   Occupation: retired  Tobacco Use   Smoking status: Never   Smokeless tobacco: Never  Vaping Use   Vaping status: Never Used  Substance and Sexual Activity   Alcohol use: Yes    Comment: occasional   Drug use: No   Sexual activity: Not Currently  Other Topics Concern   Not on file  Social History Narrative   Not on file   Social Drivers of Health   Tobacco Use: Low Risk (12/29/2024)    Patient History    Smoking Tobacco Use: Never    Smokeless Tobacco Use: Never    Passive Exposure: Not on file  Financial Resource Strain: Not on file  Food Insecurity: Not on file  Transportation Needs: Not on file  Physical Activity: Not on file  Stress: Not on file  Social Connections: Not on file  Depression (EYV7-0): Not on file  Alcohol Screen: Not on file  Housing: Not on file  Utilities: Not on file  Health Literacy: Not on file     Family History: The patient's family history includes Cancer in his mother.  ROS:   Please see the history of present illness.    ROS  All other systems reviewed and negative.   EKGs/Labs/Other Studies Reviewed:    The following studies were reviewed today:        Recent Labs: No results found for requested labs within last 365 days.   Recent Lipid Panel    Component Value Date/Time   CHOL 137 10/16/2014 0740   TRIG 46.0 10/16/2014 0740   HDL 60.90 10/16/2014 0740   CHOLHDL 2 10/16/2014 0740   VLDL 9.2 10/16/2014 0740   LDLCALC 67 10/16/2014 0740    Physical Exam:    VS:  BP 122/70 (BP Location: Left Arm, Patient Position: Sitting, Cuff Size: Normal)   Pulse 62   Resp 18   Ht 5' 5 (1.651 m)   Wt 127 lb (57.6 kg)   SpO2 100%   BMI 21.13 kg/m     Wt Readings from Last 3 Encounters:  12/29/24 127 lb (57.6 kg)  07/19/23 123 lb (55.8 kg)  04/23/22 123 lb 6.4 oz (56 kg)    GEN: Well nourished, well developed in no acute distress HEENT: Normal NECK: No JVD; No carotid bruits LYMPHATICS: No lymphadenopathy CARDIAC:RRR, no murmurs, rubs, gallops RESPIRATORY:  Clear to auscultation without rales, wheezing or rhonchi  ABDOMEN: Soft, non-tender, non-distended MUSCULOSKELETAL:  No edema; No deformity  SKIN: Warm and dry NEUROLOGIC:  Alert and oriented x 3 PSYCHIATRIC:  Normal affect  ASSESSMENT:    1. Coronary artery disease involving native coronary artery of native heart without angina pectoris      PLAN:     In order of problems listed above:  ASCAD  -s/p PCI of LAD 2013.   -Denies any anginal symptoms since I saw him last -Continue aspirin  81 mg daily, Zocor  40 mg daily with as needed refills  DCM  -EF 45-50% by echo 2019 with diffuse HK and G2DD -EF 55 to 60% on  echo 2023 -He appears euvolemic on exam today -Continue ramipril  2.5 mg daily with as needed refills -I have personally reviewed and interpreted outside labs performed by patient's PCP which showed ***  PVCs and VT  -s/p ablation  - Okay denies any recent palpitations  Hyperlipidemia  -LDL goal < 70.   -I have personally reviewed and interpreted outside labs performed by patient's PCP which showed *** -Continue simvastatin  40 mg daily with as needed refills  RBBB  -he is asymptomatic and is very active riding his stationary 2.5 hrs per day but not straight in a ros -stress myoview  2019 with no ischemia  Syncope -felt to be micturition induced -he was bradycardic in ER in the 30's but normally runs 40-50's -He has had no further episodes of dizziness or syncope -30-day event monitor showed Normal sinus rhythm and a rare PAC with average heart rate 63 bpm  Followup:  1 year  Medication Adjustments/Labs and Tests Ordered: Current medicines are reviewed at length with the patient today.  Concerns regarding medicines are outlined above.  Orders Placed This Encounter  Procedures   EKG 12-Lead   No orders of the defined types were placed in this encounter.   Signed, Wilbert Bihari, MD  12/29/2024 8:33 AM    [facility] "

## 2024-12-29 NOTE — Patient Instructions (Signed)
 Medication Instructions:  Your physician has recommended you make the following change in your medication:   -Stop taking Simvastatin  (Zocor ).  -Start taking Rosuvastatin  (Crestor ) 40mg  once daily.   *If you need a refill on your cardiac medications before your next appointment, please call your pharmacy*  Lab Work: Your physician recommends that you return for lab work in: 6 weeks for FASTING CMET & Lipids  If you have labs (blood work) drawn today and your tests are completely normal, you will receive your results only by: MyChart Message (if you have MyChart) OR A paper copy in the mail If you have any lab test that is abnormal or we need to change your treatment, we will call you to review the results.   Follow-Up: At [facility], you and your health needs are our priority.  As part of our continuing mission to provide you with exceptional heart care, our providers are all part of one team.  This team includes your primary Cardiologist (physician) and Advanced Practice Providers or APPs (Physician Assistants and Nurse Practitioners) who all work together to provide you with the care you need, when you need it.  Your next appointment:   12 month(s)  Provider:   Wilbert Bihari, MD    We recommend signing up for the patient portal called MyChart.  Sign up information is provided on this After Visit Summary.  MyChart is used to connect with patients for Virtual Visits (Telemedicine).  Patients are able to view lab/test results, encounter notes, upcoming appointments, etc.  Non-urgent messages can be sent to your provider as well.   To learn more about what you can do with MyChart, go to [email or web address].   Other Instructions

## 2024-12-29 NOTE — Progress Notes (Signed)
 " Cardiology Office Note:    Date:  12/29/2024   ID:  Patrick Barnes, DOB [birth year], MRN [id number]  PCP:  Dayna Motto, DO  Cardiologist:  Wilbert Bihari, MD    Referring MD: Dayna Motto, DO   Chief Complaint  Patient presents with   Coronary artery disease involving native coronary artery of   Follow-up    1 year    History of Present Illness:    Patrick Barnes is a 74 y.o. male with a hx of ASCAD s/p PCI of LAD 2013, RVOT PVCs and VT s/p ablation, ischemic DCM EF 45-50%, micturition syncope and dyslipidemia. The patient is here today and is doing well.  He denies any CP or pressure, SOB, DOE, PND, orthopnea, LE edema, dizziness, palpitations or syncope.   Past Medical History:  Diagnosis Date   Anxiety    Arthritis    hands   Bradycardia 07/23/2016   CAD (coronary artery disease) 01/2012   80% stenosis in proximal LAD with PCI   Cancer [facility])    prostate   Dilated cardiomyopathy (HCC)    Echo 11/2012 EF 45-50%   Dyslipidemia    Elevated CPK    Proboly due to excessive exercise.   Elevated LFTs    history of-Now resolved   H/O vasectomy    Hx of tonsillectomy    Hypogonadotropic hypogonadism    Dr. Faythe and Dr Watt   Inguinal hernia    Low bone density for age    endo is following   Low testosterone     PVC's (premature ventricular contractions)    s/p ablation Dr Kelsie and Dr Bihari   RBBB    Vitamin D deficiency     Past Surgical History:  Procedure Laterality Date   CARDIAC CATHETERIZATION     w 80% stenosis in proimal LAD w PCI on 2/13.   COLONOSCOPY  10/2005   had internal nonbleeding small hemorrhoids,repeat in 10 years.   CYSTECTOMY     EP study and ablation for RVOT PVCs  08/12/12   HERNIA REPAIR     Inguinal   LYMPHADENECTOMY Bilateral 02/10/2021   Procedure: LYMPHADENECTOMY, PELVIC;  Surgeon: Renda Glance, MD;  Location: [facility];  Service: Urology;  Laterality: Bilateral;   PERCUTANEOUS CORONARY STENT INTERVENTION (PCI-S) Bilateral 01/13/2012    Procedure: PERCUTANEOUS CORONARY STENT INTERVENTION (PCI-S);  Surgeon: Candyce GORMAN Reek, MD;  Location: [facility] CATH LAB;  Service: Cardiovascular;  Laterality: Bilateral;  possible radial artery   ROBOT ASSISTED LAPAROSCOPIC RADICAL PROSTATECTOMY N/A 02/10/2021   Procedure: XI ROBOTIC ASSISTED LAPAROSCOPIC RADICAL PROSTATECTOMY LEVEL 2;  Surgeon: Renda Glance, MD;  Location: [facility];  Service: Urology;  Laterality: N/A;   TONSILLECTOMY     V-TACH ABLATION N/A 08/11/2012   Procedure: V-TACH ABLATION;  Surgeon: Lynwood Kelsie, MD;  Location: [facility] CATH LAB;  Service: Cardiovascular;  Laterality: N/A;   VASECTOMY      Current Medications: Current Meds  Medication Sig   acetaminophen  (TYLENOL ) 500 MG tablet Take 1,000 mg by mouth daily as needed. For sinus headaches   aspirin  EC 81 MG tablet Take 81 mg by mouth daily. Swallow whole.   cholecalciferol (VITAMIN D3) 25 MCG (1000 UNIT) tablet Take 1,000 Units by mouth every other day.   escitalopram  (LEXAPRO ) 10 MG tablet Take 10 mg by mouth daily.   nitroGLYCERIN  (NITROSTAT ) 0.4 MG SL tablet PLACE 1 TABLET UNDER TONGUE EVERY 5 MINUTES AS NEEDED FOR CHEST PAIN.SABRA MAXIMUM OF 3 DOSES   ramipril  (ALTACE ) 2.5 MG capsule  Take 2.5 mg by mouth daily.    simvastatin  (ZOCOR ) 40 MG tablet Take 40 mg by mouth daily.     Allergies:   Quinolones   Social History   Socioeconomic History   Marital status: Married    Spouse name: Not on file   Number of children: Not on file   Years of education: Not on file   Highest education level: Not on file  Occupational History   Occupation: retired  Tobacco Use   Smoking status: Never   Smokeless tobacco: Never  Vaping Use   Vaping status: Never Used  Substance and Sexual Activity   Alcohol use: Yes    Comment: occasional   Drug use: No   Sexual activity: Not Currently  Other Topics Concern   Not on file  Social History Narrative   Not on file   Social Drivers of Health   Tobacco Use: Low Risk (12/29/2024)    Patient History    Smoking Tobacco Use: Never    Smokeless Tobacco Use: Never    Passive Exposure: Not on file  Financial Resource Strain: Not on file  Food Insecurity: Not on file  Transportation Needs: Not on file  Physical Activity: Not on file  Stress: Not on file  Social Connections: Not on file  Depression (EYV7-0): Not on file  Alcohol Screen: Not on file  Housing: Not on file  Utilities: Not on file  Health Literacy: Not on file     Family History: The patient's family history includes Cancer in his mother.  ROS:   Please see the history of present illness.    ROS  All other systems reviewed and negative.   EKGs/Labs/Other Studies Reviewed:    The following studies were reviewed today:      EKG Interpretation Date/Time:  Friday December 29 2024 08:31:54 EST Ventricular Rate:  50 PR Interval:  172 QRS Duration:  144 QT Interval:  446 QTC Calculation: 406 R Axis:   44  Text Interpretation: Sinus bradycardia Right bundle branch block When compared with ECG of 19-Jul-2023 14:16, No significant change was found Confirmed by Shlomo Corning ([id number]) on 12/29/2024 8:37:50 AM    Recent Labs: No results found for requested labs within last 365 days.   Recent Lipid Panel    Component Value Date/Time   CHOL 137 10/16/2014 0740   TRIG 46.0 10/16/2014 0740   HDL 60.90 10/16/2014 0740   CHOLHDL 2 10/16/2014 0740   VLDL 9.2 10/16/2014 0740   LDLCALC 67 10/16/2014 0740    Physical Exam:    VS:  BP 122/70 (BP Location: Left Arm, Patient Position: Sitting, Cuff Size: Normal)   Pulse 62   Resp 18   Ht 5' 5 (1.651 m)   Wt 127 lb (57.6 kg)   SpO2 100%   BMI 21.13 kg/m     Wt Readings from Last 3 Encounters:  12/29/24 127 lb (57.6 kg)  07/19/23 123 lb (55.8 kg)  04/23/22 123 lb 6.4 oz (56 kg)    GEN: Well nourished, well developed in no acute distress HEENT: Normal NECK: No JVD; No carotid bruits LYMPHATICS: No lymphadenopathy CARDIAC:RRR, no murmurs, rubs,  gallops RESPIRATORY:  Clear to auscultation without rales, wheezing or rhonchi  ABDOMEN: Soft, non-tender, non-distended MUSCULOSKELETAL:  No edema; No deformity  SKIN: Warm and dry NEUROLOGIC:  Alert and oriented x 3 PSYCHIATRIC:  Normal affect  ASSESSMENT:    1. Coronary artery disease involving native coronary artery of native heart without angina pectoris  PLAN:    In order of problems listed above:  ASCAD  -s/p PCI of LAD 2013.   -Denies any anginal symptoms since I saw him last -he still rides a stationary bike 2.5hrs with no symptoms so no further ischemic workup needed at this time -Continue aspirin  81 mg daily -changing simvastatin  to Crestor  40mg  daily  DCM  -EF 45-50% by echo 2019 with diffuse HK and G2DD -EF 55 to 60% on echo 2023 -He appears euvolemic on exam today -Continue ramipril  2.5 mg daily with as needed refills -will check a CMET at time of FLP in 6 weeks  PVCs and VT  -s/p ablation  -denies any recent palpitations  Hyperlipidemia  -LDL goal < 70.   -I have personally reviewed and interpreted outside labs performed by patient's PCP which showed LDL 85 and HDL 67 on 09/07/2024 -Change simvastatin  to Crestor  40 mg daily -Repeat FLP in 6 weeks  RBBB  -he is asymptomatic and is very active riding his stationary 2.5 hrs per day but not straight in a row -stress myoview  2019 with no ischemia  Syncope -felt to be micturition induced -he was bradycardic in ER in the 30's but normally runs 40-50's -He has had no further episodes of dizziness or syncope -30-day event monitor showed Normal sinus rhythm and a rare PAC with average heart rate 63 bpm  Followup:  1 year  Medication Adjustments/Labs and Tests Ordered: Current medicines are reviewed at length with the patient today.  Concerns regarding medicines are outlined above.  Orders Placed This Encounter  Procedures   EKG 12-Lead   No orders of the defined types were placed in this  encounter.   Signed, Wilbert Bihari, MD  12/29/2024 8:35 AM    [facility] "
